# Patient Record
Sex: Male | Born: 1965 | Race: White | Hispanic: No | Marital: Married | Smoking: Former smoker
Health system: Southern US, Community
[De-identification: ages and names within clinical notes are randomized; demographics above are authoritative.]

## PROBLEM LIST (undated history)

## (undated) DIAGNOSIS — I480 Paroxysmal atrial fibrillation: Secondary | ICD-10-CM

## (undated) DIAGNOSIS — J309 Allergic rhinitis, unspecified: Secondary | ICD-10-CM

## (undated) DIAGNOSIS — G4730 Sleep apnea, unspecified: Secondary | ICD-10-CM

## (undated) DIAGNOSIS — J45909 Unspecified asthma, uncomplicated: Secondary | ICD-10-CM

## (undated) DIAGNOSIS — T783XXA Angioneurotic edema, initial encounter: Secondary | ICD-10-CM

## (undated) DIAGNOSIS — C4491 Basal cell carcinoma of skin, unspecified: Secondary | ICD-10-CM

## (undated) DIAGNOSIS — J449 Chronic obstructive pulmonary disease, unspecified: Secondary | ICD-10-CM

## (undated) DIAGNOSIS — J301 Allergic rhinitis due to pollen: Secondary | ICD-10-CM

## (undated) DIAGNOSIS — I5189 Other ill-defined heart diseases: Secondary | ICD-10-CM

## (undated) DIAGNOSIS — D3610 Benign neoplasm of peripheral nerves and autonomic nervous system, unspecified: Secondary | ICD-10-CM

## (undated) DIAGNOSIS — I10 Essential (primary) hypertension: Secondary | ICD-10-CM

## (undated) HISTORY — DX: Paroxysmal atrial fibrillation: I48.0

## (undated) HISTORY — DX: Sleep apnea, unspecified: G47.30

## (undated) HISTORY — PX: APPENDECTOMY: SHX54

## (undated) HISTORY — DX: Allergic rhinitis due to pollen: J30.1

## (undated) HISTORY — PX: LUNG SURGERY: SHX703

## (undated) HISTORY — DX: Allergic rhinitis, unspecified: J30.9

## (undated) HISTORY — DX: Unspecified asthma, uncomplicated: J45.909

## (undated) HISTORY — DX: Chronic obstructive pulmonary disease, unspecified: J44.9

---

## 2013-11-16 DIAGNOSIS — I160 Hypertensive urgency: Secondary | ICD-10-CM | POA: Insufficient documentation

## 2013-11-19 DIAGNOSIS — R42 Dizziness and giddiness: Secondary | ICD-10-CM | POA: Insufficient documentation

## 2013-11-19 DIAGNOSIS — J948 Other specified pleural conditions: Secondary | ICD-10-CM | POA: Insufficient documentation

## 2013-11-23 DIAGNOSIS — R011 Cardiac murmur, unspecified: Secondary | ICD-10-CM | POA: Insufficient documentation

## 2013-11-23 DIAGNOSIS — E669 Obesity, unspecified: Secondary | ICD-10-CM | POA: Insufficient documentation

## 2013-12-29 DIAGNOSIS — D3610 Benign neoplasm of peripheral nerves and autonomic nervous system, unspecified: Secondary | ICD-10-CM | POA: Insufficient documentation

## 2014-05-24 DIAGNOSIS — J90 Pleural effusion, not elsewhere classified: Secondary | ICD-10-CM | POA: Insufficient documentation

## 2014-07-09 DIAGNOSIS — R0902 Hypoxemia: Secondary | ICD-10-CM | POA: Insufficient documentation

## 2014-07-09 DIAGNOSIS — J159 Unspecified bacterial pneumonia: Secondary | ICD-10-CM | POA: Insufficient documentation

## 2015-02-04 ENCOUNTER — Encounter: Payer: Self-pay | Admitting: Physician Assistant

## 2015-02-04 ENCOUNTER — Ambulatory Visit (INDEPENDENT_AMBULATORY_CARE_PROVIDER_SITE_OTHER): Payer: Managed Care, Other (non HMO) | Admitting: Physician Assistant

## 2015-02-04 VITALS — BP 150/70 | HR 60 | Temp 97.9°F | Resp 16 | Wt 334.6 lb

## 2015-02-04 DIAGNOSIS — G4733 Obstructive sleep apnea (adult) (pediatric): Secondary | ICD-10-CM

## 2015-02-04 DIAGNOSIS — I10 Essential (primary) hypertension: Secondary | ICD-10-CM | POA: Insufficient documentation

## 2015-02-04 DIAGNOSIS — L309 Dermatitis, unspecified: Secondary | ICD-10-CM | POA: Diagnosis not present

## 2015-02-04 DIAGNOSIS — J45909 Unspecified asthma, uncomplicated: Secondary | ICD-10-CM | POA: Insufficient documentation

## 2015-02-04 DIAGNOSIS — N200 Calculus of kidney: Secondary | ICD-10-CM | POA: Diagnosis not present

## 2015-02-04 MED ORDER — LISINOPRIL 20 MG PO TABS: 20.0000 mg | ORAL_TABLET | Freq: Every day | ORAL | Status: DC

## 2015-02-04 MED ORDER — TRIAMCINOLONE ACETONIDE 0.1 % EX CREA: 1.0000 "application " | TOPICAL_CREAM | Freq: Two times a day (BID) | CUTANEOUS | Status: DC

## 2015-02-04 MED ORDER — VERAPAMIL HCL ER 120 MG PO TBCR: 120.0000 mg | EXTENDED_RELEASE_TABLET | Freq: Every day | ORAL | Status: DC

## 2015-02-04 NOTE — Progress Notes (Signed)
Patient: Geoffrey [name], Male    DOB: December 10, 1965, 49 y.o.   MRN: [id number] Visit Date: 02/04/2015  Today's Provider: Mar Daring, PA-C   Chief Complaint  Patient presents with  . Establish Care   Subjective:    Annual physical exam Geoffrey [name] is a 49 y.o. male who presents today for health maintenance and complete physical. He feels fairly well. He reports exercising walks everyday at work. He reports he is sleeping fairly well.   He does have an extensive past medical history. 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 They were both benign. This was done by Dr. Selena Batten with [facility]. He does see him annually for repeat chest x-rays. He states he was last seen by him approximately 5 months ago and his chest x-ray was normal. They also do pulmonary function testing for him. He does not have a regular pulmonologist.  He also has had echocardiograms and stress test done for chest pain which is now thought to have been caused by the tumors. These all came back negative. He does state he was told that he had a leaky aortic valve that was not problematic at this time and also some decreased wall movement and one of the walls but could not remember where. He also has had hypertension for a while. He states that before medication he would run in the 190s to 200s over 90s to 100s. He was put on HCTZ but did not tolerate this medication well. He also cannot tolerate beta blockers secondary to his lung disease as stated above. He currently is taking lisinopril 40 mg daily. He does not check his blood pressure regularly. He denies any chest pain, shortness of breath, dyspnea on exertion, orthopnea or lower extremity edema.  He also has obstructive sleep apnea. He has been on CPAP for a long time. He is requesting to have another sleep study as he does not feel that his CPAP is working well and he does feel that his mask is not fitting right for him any longer. This is  secondary to weight gain since his surgeries.  He also has a rash that he develops on his face and his right lower extremity on the anterior shin. It is red, scaly and itchy. He states that most often condoms during times where he is highly stressed and during times of dry weather.  -----------------------------------------------------------------   Review of Systems  Constitutional: Negative.   HENT: Negative.   Eyes: Negative.   Respiratory: Positive for apnea, shortness of breath and wheezing. Negative for cough, choking, chest tightness and stridor.   Gastrointestinal: Positive for constipation. Negative for nausea, vomiting, abdominal pain, diarrhea, blood in stool and abdominal distention.  Endocrine: Negative.   Genitourinary: Positive for urgency. Negative for dysuria, frequency, hematuria, flank pain, decreased urine volume, discharge, penile swelling, scrotal swelling, enuresis, difficulty urinating, genital sores, penile pain and testicular pain.  Musculoskeletal: Positive for neck stiffness (with stress). Negative for myalgias, back pain, joint swelling, arthralgias, gait problem and neck pain.  Skin: Positive for rash (right anterior lower leg). Negative for color change, pallor and wound.  Allergic/Immunologic: Negative.   Neurological: Positive for dizziness. Negative for seizures, syncope, weakness, light-headedness, numbness and headaches.  Hematological: Negative.   Psychiatric/Behavioral: Negative.     Social History He  reports that he has never smoked. His smokeless tobacco use includes Chew. He reports that he drinks alcohol. He reports that he does not use  illicit drugs. Social History   Social History  . Marital Status: Single    Spouse Name: N/A  . Number of Children: N/A  . Years of Education: N/A   Social History Main Topics  . Smoking status: Never Smoker   . Smokeless tobacco: Current User    Types: Chew  . Alcohol Use: Yes     Comment: 3-4  Bi-weekly; have been a heavy drinker in the past.  . Drug Use: No  . Sexual Activity: Not Asked   Other Topics Concern  . None   Social History Narrative  . None    Patient Active Problem List   Diagnosis Date Noted  . Asthma 02/04/2015  . Hypertension 02/04/2015  . Kidney stones 02/04/2015    History reviewed. No pertinent past surgical history.  Family History  Family Status  Relation Status Death Age  . Mother Deceased 80    aneurysm  . Father Alive    His family history is not on file.    Allergies  Allergen Reactions  . Aspirin     Previous Medications   LISINOPRIL (PRINIVIL,ZESTRIL) 20 MG TABLET    Take 20 mg by mouth 2 (two) times daily.    Patient Care Team: Mar Daring, PA-C as PCP - General (Family Medicine)     Objective:   Vitals: BP 150/70 mmHg  Pulse 60  Temp(Src) 97.9 F (36.6 C) (Oral)  Resp 16  Wt 334 lb 9.6 oz (151.774 kg)  SpO2 95%   Physical Exam  Constitutional: He is oriented to person, place, and time. He appears well-developed and well-nourished.  HENT:  Head: Normocephalic and atraumatic.  Right Ear: Hearing, tympanic membrane, external ear and ear canal normal.  Left Ear: Hearing, tympanic membrane, external ear and ear canal normal.  Nose: Nose normal.  Mouth/Throat: Uvula is midline, oropharynx is clear and moist and mucous membranes are normal.  Eyes: Conjunctivae and EOM are normal. Pupils are equal, round, and reactive to light. Right eye exhibits no discharge.  Neck: Normal range of motion. Neck supple. No JVD present. No tracheal deviation present. No thyromegaly present.  Cardiovascular: Normal rate, regular rhythm, normal heart sounds and intact distal pulses.  Exam reveals no gallop and no friction rub.   No murmur heard. Pulmonary/Chest: Effort normal and breath sounds normal. No respiratory distress. He has no wheezes. He has no rales.  Abdominal: Soft. Bowel sounds are normal. He exhibits no distension and  no mass. There is no tenderness. There is no rebound and no guarding.  Musculoskeletal: Normal range of motion. He exhibits no edema or tenderness.  Lymphadenopathy:    He has no cervical adenopathy.  Neurological: He is alert and oriented to person, place, and time. He has normal reflexes. No cranial nerve deficit. He exhibits normal muscle tone. Coordination normal.  Skin: Skin is warm and dry. Rash noted. No erythema.     Psychiatric: He has a normal mood and affect. His behavior is normal. Judgment and thought content normal.     Depression Screen No flowsheet data found.    Assessment & Plan:     Routine Health Maintenance and Physical Exam  1. Essential hypertension Blood pressure today was 150/70. His with him taking 40 mg of lisinopril. I advised that 40 mg of lisinopril is not recommended and he agreed to decrease the dose to 20 mg daily and add a second agent. We will try verapamil sustained release as below. I will see him back in  4 weeks to recheck his blood pressure. He is to call me if he has any adverse reactions with the verapamil. I will most likely check labs when he returns in 4 weeks. - lisinopril (PRINIVIL,ZESTRIL) 20 MG tablet; Take 1 tablet (20 mg total) by mouth daily.  Dispense: 30 tablet; Refill: 11 - verapamil (CALAN-SR) 120 MG CR tablet; Take 1 tablet (120 mg total) by mouth at bedtime.  Dispense: 30 tablet; Refill: 1  2. Kidney stones History of kidney stones. He has not had any recently.  3. Eczema I do feel that the rash that is located on his right lower extremity is consistent with eczema. We will attempt treatment with triamcinolone cream as below. He is to call the office if symptoms fail to improve or worsen with the rash. - triamcinolone cream (KENALOG) 0.1 %; Apply 1 application topically 2 (two) times daily.  Dispense: 80 g; Refill: 0  4. OSA (obstructive sleep apnea) He does have obstructive sleep apnea. He currently does have a CPAP machine  at his house which she uses but he does not feel the settings are appropriate for him nor does he feel his mask fits appropriately anymore. He states that he is starting to wake up with headaches like he did before he was diagnosed with sleep apnea. He would like to undergo another sleep study to see if his parameters and mask to be changed. I will make the referral for him and they will contact him to schedule the sleep study based on his schedule. He is to call the office if he has any questions or concerns. I will be seeing him back in 4 weeks to recheck his blood pressure and we can readdress this at that time as well. - Ambulatory referral to Sleep Studies   Exercise Activities and Dietary recommendations Goals    None       There is no immunization history on file for this patient.  Health Maintenance  Topic Date Due  . HIV Screening  12/22/1979  . TETANUS/TDAP  12/22/1983  . INFLUENZA VACCINE  10/25/2014  . COLONOSCOPY  12/22/2014      Discussed health benefits of physical activity, and encouraged him to engage in regular exercise appropriate for his age and condition.    I spent approximately 45 minutes with the patient today. Over 50% of this time was spent with counseling and educating the patient on hypertension and medications.  --------------------------------------------------------------------

## 2015-02-04 NOTE — Patient Instructions (Signed)
Hypertension Hypertension, commonly called high blood pressure, is when the force of blood pumping through your arteries is too strong. Your arteries are the blood vessels that carry blood from your heart throughout your body. A blood pressure reading consists of a higher number over a lower number, such as 110/72. The higher number (systolic) is the pressure inside your arteries when your heart pumps. The lower number (diastolic) is the pressure inside your arteries when your heart relaxes. Ideally you want your blood pressure below 120/80. Hypertension forces your heart to work harder to pump blood. Your arteries may become narrow or stiff. Having untreated or uncontrolled hypertension can cause heart attack, stroke, kidney disease, and other problems. RISK FACTORS Some risk factors for high blood pressure are controllable. Others are not.  Risk factors you cannot control include:   Race. You may be at higher risk if you are African American.  Age. Risk increases with age.  Gender. Men are at higher risk than women before age 45 years. After age 65, women are at higher risk than men. Risk factors you can control include:  Not getting enough exercise or physical activity.  Being overweight.  Getting too much fat, sugar, calories, or salt in your diet.  Drinking too much alcohol. SIGNS AND SYMPTOMS Hypertension does not usually cause signs or symptoms. Extremely high blood pressure (hypertensive crisis) may cause headache, anxiety, shortness of breath, and nosebleed. DIAGNOSIS To check if you have hypertension, your health care provider will measure your blood pressure while you are seated, with your arm held at the level of your heart. It should be measured at least twice using the same arm. Certain conditions can cause a difference in blood pressure between your right and left arms. A blood pressure reading that is higher than normal on one occasion does not mean that you need treatment. If  it is not clear whether you have high blood pressure, you may be asked to return on a different day to have your blood pressure checked again. Or, you may be asked to monitor your blood pressure at home for 1 or more weeks. TREATMENT Treating high blood pressure includes making lifestyle changes and possibly taking medicine. Living a healthy lifestyle can help lower high blood pressure. You may need to change some of your habits. Lifestyle changes may include:  Following the DASH diet. This diet is high in fruits, vegetables, and whole grains. It is low in salt, red meat, and added sugars.  Keep your sodium intake below 2,300 mg per day.  Getting at least 30-45 minutes of aerobic exercise at least 4 times per week.  Losing weight if necessary.  Not smoking.  Limiting alcoholic beverages.  Learning ways to reduce stress. Your health care provider may prescribe medicine if lifestyle changes are not enough to get your blood pressure under control, and if one of the following is true:  You are 18-59 years of age and your systolic blood pressure is above 140.  You are 60 years of age or older, and your systolic blood pressure is above 150.  Your diastolic blood pressure is above 90.  You have diabetes, and your systolic blood pressure is over 140 or your diastolic blood pressure is over 90.  You have kidney disease and your blood pressure is above 140/90.  You have heart disease and your blood pressure is above 140/90. Your personal target blood pressure may vary depending on your medical conditions, your age, and other factors. HOME CARE INSTRUCTIONS    Have your blood pressure rechecked as directed by your health care provider.   Take medicines only as directed by your health care provider. Follow the directions carefully. Blood pressure medicines must be taken as prescribed. The medicine does not work as well when you skip doses. Skipping doses also puts you at risk for  problems.  Do not smoke.   Monitor your blood pressure at home as directed by your health care provider. SEEK MEDICAL CARE IF:   You think you are having a reaction to medicines taken.  You have recurrent headaches or feel dizzy.  You have swelling in your ankles.  You have trouble with your vision. SEEK IMMEDIATE MEDICAL CARE IF:  You develop a severe headache or confusion.  You have unusual weakness, numbness, or feel faint.  You have severe chest or abdominal pain.  You vomit repeatedly.  You have trouble breathing. MAKE SURE YOU:   Understand these instructions.  Will watch your condition.  Will get help right away if you are not doing well or get worse.   This information is not intended to replace advice given to you by your health care provider. Make sure you discuss any questions you have with your health care provider.   Document Released: 03/12/2005 Document Revised: 07/27/2014 Document Reviewed: 01/02/2013 Elsevier Interactive Patient Education 2016 [place].   Trandolapril; Verapamil sustained-release tablets What is this medicine? TRANDOLAPRIL; VERAPAMIL (tran DOLE a pril; ver AP a mil) is a combination of two drugs. It is used to treat high blood pressure. This medicine may be used for other purposes; ask your health care provider or pharmacist if you have questions. What should I tell my health care provider before I take this medicine? They need to know if you have any of these conditions: -bone marrow disease -collagen-vascular disease like scleroderma -heart or blood vessel disease -if you are on a special diet, such as a low-salt diet -immune system disease like lupus -kidney or liver disease -low blood pressure -previous swelling of the tongue, face, or lips with difficulty breathing, difficulty swallowing, hoarseness, or tightening of the throat -slow or irregular heartbeat -an unusual or allergic reaction to trandolapril, verapamil,  other medicines, insect venom, foods, dyes, or preservatives -pregnant or trying to get pregnant -breast-feeding How should I use this medicine? Take this medicine by mouth with a glass of water. Follow the directions on the prescription label. Swallow whole. Do not break, crush or chew. Take this medicine with food. Take your doses at regular intervals. Do not take your medicine more often than directed. Do not stop taking except on the advice of your doctor or health care professional. Talk to your pediatrician regarding the use of this medicine in children. Special care may be needed. Overdosage: If you think you have taken too much of this medicine contact a poison control center or emergency room at once. NOTE: This medicine is only for you. Do not share this medicine with others. What if I miss a dose? If you miss a dose, take it as soon as you can. If it is almost time for your next dose, take only that dose. Do not take double or extra doses. What may interact with this medicine? Do not take this medicine with any of the following medications: -alfuzosin -cisapride -disopyramide -dofetilide -pimozide -red yeast rice This medicine may also interact with the following medications: -barbiturates such as phenobarbital -cimetidine -cyclosporine -diuretics -grapefruit juice -lithium -local anesthetics or general anesthetics -medicines for heart-rhythm problems  like amiodarone, digoxin, flecainide, procainamide, quinidine -medicines for high blood pressure or heart problems -medicines for seizures like carbamazepine and phenytoin -potassium salts or potassium supplements -rifampin, rifabutin, rifapentine -theophylline or aminophylline This list may not describe all possible interactions. Give your health care provider a list of all the medicines, herbs, non-prescription drugs, or dietary supplements you use. Also tell them if you smoke, drink alcohol, or use illegal drugs. Some items  may interact with your medicine. What should I watch for while using this medicine? Visit your doctor or health care professional for regular checks on your progress. Check your blood pressure as directed. Ask your doctor or health care professional what your blood pressure should be and when you should contact him or her. Call your doctor or health care professional if you notice an irregular or fast heart beat. You may get drowsy or dizzy. Do not drive, use machinery, or do anything that needs mental alertness until you know how this drug affects you. Do not stand or sit up quickly, especially if you are an older patient. This reduces the risk of dizzy or fainting spells. Alcohol can make you more drowsy and dizzy. Avoid alcoholic drinks. Women should inform their doctor if they wish to become pregnant or think they might be pregnant. There is a potential for serious side effects to an unborn child. Talk to your health care professional or pharmacist for more information. Check with your doctor or health care professional if you get an attack of severe diarrhea, nausea and vomiting, or if you sweat a lot. The loss of too much body fluid can make it dangerous for you to take this medicine. Avoid salt substitutes unless you are told otherwise by your doctor or health care professional. Do not treat yourself for coughs, colds, or pain while you are taking this medicine without asking your doctor or health care professional for advice. Some ingredients may increase your blood pressure. This medicine can sometimes cause dental problems for some patients. Clean and floss your teeth carefully and regularly. Check with your dentist if your gums get swollen or inflamed and have the dentist clean your teeth regularly. What side effects may I notice from receiving this medicine? Side effects that you should report to your doctor or health care professional as soon as possible: -allergic reactions like skin rash or  hives, swelling of the hands, feet, face, lips, throat, or tongue -decreased amount of urine passed -difficulty breathing, or swallowing -persistent dry cough -redness, blistering, peeling or loosening of the skin, including inside the mouth -stomach pain -swelling of your legs and ankles -irregular or fast heartbeat, chest pain, palpitations Side effects that usually do not require medical attention (report to your doctor or health care professional if they continue or are bothersome): -decreased sexual function or desire -drowsiness or dizziness -facial flushing -headache This list may not describe all possible side effects. Call your doctor for medical advice about side effects. You may report side effects to FDA at 1-800-FDA-1088. Where should I keep my medicine? Keep out of the reach of children. Store at room temperature between 15 and 25 degrees C (59 and 77 degrees F). Keep container tightly closed. Throw away any unused medicine after the expiration date. NOTE: This sheet is a summary. It may not cover all possible information. If you have questions about this medicine, talk to your doctor, pharmacist, or health care provider.    2016, Elsevier/Gold Standard. (2007-11-27 11:33:54)

## 2015-03-04 ENCOUNTER — Ambulatory Visit (INDEPENDENT_AMBULATORY_CARE_PROVIDER_SITE_OTHER): Payer: Managed Care, Other (non HMO) | Admitting: Physician Assistant

## 2015-03-04 ENCOUNTER — Encounter: Payer: Self-pay | Admitting: Physician Assistant

## 2015-03-04 VITALS — BP 162/90 | HR 61 | Temp 98.6°F | Resp 16 | Wt 330.6 lb

## 2015-03-04 DIAGNOSIS — I10 Essential (primary) hypertension: Secondary | ICD-10-CM

## 2015-03-04 MED ORDER — VERAPAMIL HCL ER 120 MG PO TBCR: 120.0000 mg | EXTENDED_RELEASE_TABLET | Freq: Every day | ORAL | Status: DC

## 2015-03-04 NOTE — Patient Instructions (Signed)
Hypertension Hypertension, commonly called high blood pressure, is when the force of blood pumping through your arteries is too strong. Your arteries are the blood vessels that carry blood from your heart throughout your body. A blood pressure reading consists of a higher number over a lower number, such as 110/72. The higher number (systolic) is the pressure inside your arteries when your heart pumps. The lower number (diastolic) is the pressure inside your arteries when your heart relaxes. Ideally you want your blood pressure below 120/80. Hypertension forces your heart to work harder to pump blood. Your arteries may become narrow or stiff. Having untreated or uncontrolled hypertension can cause heart attack, stroke, kidney disease, and other problems. RISK FACTORS Some risk factors for high blood pressure are controllable. Others are not.  Risk factors you cannot control include:   Race. You may be at higher risk if you are African American.  Age. Risk increases with age.  Gender. Men are at higher risk than women before age 45 years. After age 65, women are at higher risk than men. Risk factors you can control include:  Not getting enough exercise or physical activity.  Being overweight.  Getting too much fat, sugar, calories, or salt in your diet.  Drinking too much alcohol. SIGNS AND SYMPTOMS Hypertension does not usually cause signs or symptoms. Extremely high blood pressure (hypertensive crisis) may cause headache, anxiety, shortness of breath, and nosebleed. DIAGNOSIS To check if you have hypertension, your health care provider will measure your blood pressure while you are seated, with your arm held at the level of your heart. It should be measured at least twice using the same arm. Certain conditions can cause a difference in blood pressure between your right and left arms. A blood pressure reading that is higher than normal on one occasion does not mean that you need treatment. If  it is not clear whether you have high blood pressure, you may be asked to return on a different day to have your blood pressure checked again. Or, you may be asked to monitor your blood pressure at home for 1 or more weeks. TREATMENT Treating high blood pressure includes making lifestyle changes and possibly taking medicine. Living a healthy lifestyle can help lower high blood pressure. You may need to change some of your habits. Lifestyle changes may include:  Following the DASH diet. This diet is high in fruits, vegetables, and whole grains. It is low in salt, red meat, and added sugars.  Keep your sodium intake below 2,300 mg per day.  Getting at least 30-45 minutes of aerobic exercise at least 4 times per week.  Losing weight if necessary.  Not smoking.  Limiting alcoholic beverages.  Learning ways to reduce stress. Your health care provider may prescribe medicine if lifestyle changes are not enough to get your blood pressure under control, and if one of the following is true:  You are 18-59 years of age and your systolic blood pressure is above 140.  You are 60 years of age or older, and your systolic blood pressure is above 150.  Your diastolic blood pressure is above 90.  You have diabetes, and your systolic blood pressure is over 140 or your diastolic blood pressure is over 90.  You have kidney disease and your blood pressure is above 140/90.  You have heart disease and your blood pressure is above 140/90. Your personal target blood pressure may vary depending on your medical conditions, your age, and other factors. HOME CARE INSTRUCTIONS    Have your blood pressure rechecked as directed by your health care provider.   Take medicines only as directed by your health care provider. Follow the directions carefully. Blood pressure medicines must be taken as prescribed. The medicine does not work as well when you skip doses. Skipping doses also puts you at risk for  problems.  Do not smoke.   Monitor your blood pressure at home as directed by your health care provider. SEEK MEDICAL CARE IF:   You think you are having a reaction to medicines taken.  You have recurrent headaches or feel dizzy.  You have swelling in your ankles.  You have trouble with your vision. SEEK IMMEDIATE MEDICAL CARE IF:  You develop a severe headache or confusion.  You have unusual weakness, numbness, or feel faint.  You have severe chest or abdominal pain.  You vomit repeatedly.  You have trouble breathing. MAKE SURE YOU:   Understand these instructions.  Will watch your condition.  Will get help right away if you are not doing well or get worse.   This information is not intended to replace advice given to you by your health care provider. Make sure you discuss any questions you have with your health care provider.   Document Released: 03/12/2005 Document Revised: 07/27/2014 Document Reviewed: 01/02/2013 Elsevier Interactive Patient Education 2016 Elsevier Inc.  

## 2015-03-04 NOTE — Progress Notes (Signed)
Patient: Geoffrey [name]    DOB: [birth year]   49 y.o.   MRN: [id number] Visit Date: 03/04/2015  Today's Provider: Mar Daring, PA-C   Chief Complaint  Patient presents with  . Follow-up    Hypertension   Subjective:    Hypertension Chronicity: Follow-up. Associated symptoms include headaches (3 weeks ago) and malaise/fatigue (the first 3 weeks ). Pertinent negatives include no chest pain or palpitations. (Per patient 3 weeks ago he was feeling like if he was "drunk") The current treatment provides mild (In the last week) improvement. Compliance problems: Walks every day.   Patient current medications are Lisinopril and Verapamil.     Allergies  Allergen Reactions  . Aspirin    Previous Medications   LISINOPRIL (PRINIVIL,ZESTRIL) 20 MG TABLET    Take 1 tablet (20 mg total) by mouth daily.   TRIAMCINOLONE CREAM (KENALOG) 0.1 %    Apply 1 application topically 2 (two) times daily.   VERAPAMIL (CALAN-SR) 120 MG CR TABLET    Take 1 tablet (120 mg total) by mouth at bedtime.    Review of Systems  Constitutional: Positive for malaise/fatigue (the first 3 weeks ). Negative for fever, chills and appetite change.  HENT: Negative for congestion, ear pain, sinus pressure, sore throat, tinnitus, trouble swallowing and voice change.   Eyes: Negative.  Negative for visual disturbance.  Respiratory: Negative.   Cardiovascular: Negative for chest pain, palpitations and leg swelling.  Gastrointestinal: Negative.   Neurological: Positive for headaches (3 weeks ago).    Social History  Substance Use Topics  . Smoking status: Never Smoker   . Smokeless tobacco: Current User    Types: Chew  . Alcohol Use: Yes     Comment: 3-4 Bi-weekly; have been a heavy drinker in the past.   Objective:   BP 162/90 mmHg  Pulse 61  Temp(Src) 98.6 F (37 C) (Oral)  Resp 16  Wt 330 lb 9.6 oz (149.959 kg)  SpO2 96%  Physical Exam  Constitutional: He appears well-developed and  well-nourished. No distress.  Cardiovascular: Normal rate, regular rhythm and normal heart sounds.  Exam reveals no gallop and no friction rub.   No murmur heard. Pulmonary/Chest: Breath sounds normal. No respiratory distress. He has no wheezes. He has no rales.  Skin: He is not diaphoretic.  Vitals reviewed.       Assessment & Plan:     1. Essential hypertension He did not have much improvement in his blood pressure with decreasing the lisinopril and adding verapamil. He did have some side effects of the verapamil initially but states that these have now subsided and he feels great. Blood pressure in the office today was 162/90. I will increase verapamil to 1 tablet twice daily. I will also check labs as below. I will follow up with him pending these lab results. He also is going for his repeat sleep study on December 15. If his labs are stable and he tolerates the increase in verapamil I will see him back in 2 months to recheck his blood pressure and see how he is doing at that time. He is to call the office if he has any adverse reactions, acute issue, question or concerns in the meantime. - Comprehensive Metabolic Panel (CMET) - verapamil (CALAN-SR) 120 MG CR tablet; Take 1 tablet (120 mg total) by mouth at bedtime.  Dispense: 60 tablet; Refill: 1 - CBC with Differential       Mar Daring, PA-C  [facility]

## 2015-03-10 ENCOUNTER — Ambulatory Visit: Payer: Managed Care, Other (non HMO) | Attending: Otolaryngology

## 2015-03-10 DIAGNOSIS — R0683 Snoring: Secondary | ICD-10-CM | POA: Insufficient documentation

## 2015-03-10 DIAGNOSIS — G4733 Obstructive sleep apnea (adult) (pediatric): Secondary | ICD-10-CM | POA: Insufficient documentation

## 2015-03-23 ENCOUNTER — Other Ambulatory Visit: Payer: Self-pay | Admitting: Physician Assistant

## 2015-03-23 DIAGNOSIS — I10 Essential (primary) hypertension: Secondary | ICD-10-CM

## 2015-03-23 MED ORDER — VERAPAMIL HCL ER 120 MG PO TBCR
120.0000 mg | EXTENDED_RELEASE_TABLET | Freq: Two times a day (BID) | ORAL | Status: DC
Start: 2015-03-23 — End: 2015-04-05

## 2015-04-05 ENCOUNTER — Other Ambulatory Visit: Payer: Self-pay | Admitting: Physician Assistant

## 2015-04-05 DIAGNOSIS — I10 Essential (primary) hypertension: Secondary | ICD-10-CM

## 2015-04-05 MED ORDER — VERAPAMIL HCL ER 120 MG PO TBCR: 120.0000 mg | EXTENDED_RELEASE_TABLET | Freq: Two times a day (BID) | ORAL | Status: DC

## 2015-04-05 MED ORDER — LISINOPRIL 20 MG PO TABS: 20.0000 mg | ORAL_TABLET | Freq: Every day | ORAL | Status: DC

## 2015-04-05 NOTE — Telephone Encounter (Signed)
Pt contacted office for refill request on the following medications:  Medicap.  [id number]  verapamil (CALAN-SR) 120 MG CR tablet-pt is requesting this changed to 60 tablets per pt is taking 2 per day.  lisinopril (PRINIVIL,ZESTRIL) 20 MG tablet

## 2015-04-05 NOTE — Telephone Encounter (Signed)
Please advise.  Thanks,  -Joseline 

## 2015-04-07 ENCOUNTER — Ambulatory Visit (INDEPENDENT_AMBULATORY_CARE_PROVIDER_SITE_OTHER): Payer: Managed Care, Other (non HMO) | Admitting: Physician Assistant

## 2015-04-07 ENCOUNTER — Ambulatory Visit
Admission: RE | Admit: 2015-04-07 | Discharge: 2015-04-07 | Disposition: A | Discharge status: Home / Self Care | Payer: Managed Care, Other (non HMO) | Source: Ambulatory Visit | Attending: Physician Assistant | Admitting: Physician Assistant

## 2015-04-07 ENCOUNTER — Telehealth: Payer: Self-pay | Admitting: Physician Assistant

## 2015-04-07 ENCOUNTER — Encounter: Payer: Self-pay | Admitting: Physician Assistant

## 2015-04-07 VITALS — BP 156/70 | HR 60 | Temp 98.5°F | Resp 15 | Wt 336.4 lb

## 2015-04-07 DIAGNOSIS — Z86018 Personal history of other benign neoplasm: Secondary | ICD-10-CM

## 2015-04-07 DIAGNOSIS — Z23 Encounter for immunization: Secondary | ICD-10-CM

## 2015-04-07 DIAGNOSIS — R042 Hemoptysis: Secondary | ICD-10-CM | POA: Diagnosis not present

## 2015-04-07 DIAGNOSIS — R918 Other nonspecific abnormal finding of lung field: Secondary | ICD-10-CM | POA: Diagnosis not present

## 2015-04-07 DIAGNOSIS — Z8709 Personal history of other diseases of the respiratory system: Secondary | ICD-10-CM | POA: Diagnosis not present

## 2015-04-07 DIAGNOSIS — R0602 Shortness of breath: Secondary | ICD-10-CM | POA: Insufficient documentation

## 2015-04-07 DIAGNOSIS — Z9889 Other specified postprocedural states: Secondary | ICD-10-CM | POA: Insufficient documentation

## 2015-04-07 DIAGNOSIS — L739 Follicular disorder, unspecified: Secondary | ICD-10-CM | POA: Diagnosis not present

## 2015-04-07 MED ORDER — CEPHALEXIN 500 MG PO CAPS: 500.0000 mg | ORAL_CAPSULE | Freq: Two times a day (BID) | ORAL | Status: DC

## 2015-04-07 NOTE — Addendum Note (Signed)
Addended by: Mar Daring on: 04/07/2015 01:17 PM   Modules accepted: Orders

## 2015-04-07 NOTE — Progress Notes (Signed)
Patient: Geoffrey [name] Male    DOB: 09-05-1965   50 y.o.   MRN: [id number] Visit Date: 04/07/2015  Today's Provider: Mar Daring, PA-C   Chief Complaint  Patient presents with  . Bronw spot on his left side of chest   Subjective:    HPI  Geoffrey [name] is here concern about a little brown spot that is on the left side on the upper side of his chest. Noticed for the past week. Per patient it was like a light brown spot and for the past 2 weeks they notice it has raised and is more brownish and is itching. No fever. Patient also states that has been feeling more fatigue, and for the last two month patient has been spitting up more bright red bloody sputum. His chest hurts on the left side, almost feels like when his lung had fluid in it. He said yesterday he had the discomfort and his chest was feeling heavy. He turned on his left side to face his wife and got up to cough and spit up blood. He said his appetite has change. No URI symptoms.  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  This was done by Dr. Selena Batten at [facility].  He does not wish to f/u with him anymore. He would like a local CT Psychologist, sport and exercise.     Allergies  Allergen Reactions  . Aspirin    Previous Medications   LISINOPRIL (PRINIVIL,ZESTRIL) 20 MG TABLET    Take 1 tablet (20 mg total) by mouth daily.   TRIAMCINOLONE CREAM (KENALOG) 0.1 %    Apply 1 application topically 2 (two) times daily.   VERAPAMIL (CALAN-SR) 120 MG CR TABLET    Take 1 tablet (120 mg total) by mouth 2 (two) times daily.    Review of Systems  Constitutional: Positive for appetite change and fatigue.  HENT: Negative.   Eyes: Negative.   Respiratory: Positive for cough (hemoptysis) and shortness of breath. Negative for chest tightness and wheezing.   Cardiovascular: Positive for chest pain. Negative for palpitations and leg swelling.    Gastrointestinal: Negative.   Endocrine: Negative.   Genitourinary: Negative.   Musculoskeletal: Negative.   Skin: Negative.   Allergic/Immunologic: Negative.   Neurological: Negative.   Hematological: Negative.   Psychiatric/Behavioral: Negative.     Social History  Substance Use Topics  . Smoking status: Never Smoker   . Smokeless tobacco: Current User    Types: Chew  . Alcohol Use: Yes     Comment: 3-4 Bi-weekly; have been a heavy drinker in the past.   Objective:   BP 156/70 mmHg  Pulse 60  Temp(Src) 98.5 F (36.9 C) (Oral)  Resp 15  Wt 336 lb 6.4 oz (152.59 kg)  SpO2 96%  Physical Exam  Constitutional: He appears well-developed and well-nourished. No distress.  HENT:  Head: Normocephalic and atraumatic.  Neck: Normal range of motion. Neck supple. No JVD present. No tracheal deviation present. No thyromegaly present.  Cardiovascular: Normal rate, regular rhythm and normal heart sounds.  Exam reveals no gallop and no friction rub.   No murmur heard. Pulmonary/Chest: Effort normal and breath sounds normal. No respiratory distress. He has no wheezes. He has no rales.  Lymphadenopathy:    He has no cervical adenopathy.  Skin: Lesion noted. He is not diaphoretic.     Vitals reviewed.  Assessment & Plan:     1. Folliculitis Will treat below with Keflex.  He is to call the office if the area does not improve or worsens with the antibiotic.  If there is no improvement we will consider cryo therapy vs shave removal. He does not wish to have a biopsy on it.  He states he watched his 1st wife pass away from malignant melanoma and does not wish to know if that is what it is. - cephALEXin (KEFLEX) 500 MG capsule; Take 1 capsule (500 mg total) by mouth 2 (two) times daily.  Dispense: 10 capsule; Refill: 0  2. Hemoptysis Will get CXR to see if he is developing a fluid burden again in his left lung.  If CXR shows fluid will refer to CT surgery (Dr. Faith Rogue) for further  evaluation and treatment.  He also does not wish to have a pulmonologist, states he would only want a CT surgeon. I will f/u pending the CXR results. - DG Chest 2 View; Future  3. SOB (shortness of breath) See above medical treatment plan  4. H/O benign neoplasm of bronchus and lung Will give pneumococcal vaccine for prevention of pneumonia due to his lung history. Prevnar 13 given without complication. - Pneumococcal conjugate vaccine 13-valent IM  5. History of thoracentesis Done by Dr. Selena Batten, [facility].  Last f/u was approx 8 months ago.  6. Need for influenza vaccination Flu vaccine given today without complication. - Flu Vaccine QUAD 36+ mos IM       Mar Daring, PA-C  [facility]

## 2015-04-07 NOTE — Telephone Encounter (Signed)
Opened in error

## 2015-04-07 NOTE — Patient Instructions (Signed)
Folliculitis °Folliculitis is redness, soreness, and swelling (inflammation) of the hair follicles. This condition can occur anywhere on the body. People with weakened immune systems, diabetes, or obesity have a greater risk of getting folliculitis. °CAUSES °· Bacterial infection. This is the most common cause. °· Fungal infection. °· Viral infection. °· Contact with certain chemicals, especially oils and tars. °Long-term folliculitis can result from bacteria that live in the nostrils. The bacteria may trigger multiple outbreaks of folliculitis over time. °SYMPTOMS °Folliculitis most commonly occurs on the scalp, thighs, legs, back, buttocks, and areas where hair is shaved frequently. An early sign of folliculitis is a small, white or yellow, pus-filled, itchy lesion (pustule). These lesions appear on a red, inflamed follicle. They are usually less than 0.2 inches (5 mm) wide. When there is an infection of the follicle that goes deeper, it becomes a boil or furuncle. A group of closely packed boils creates a larger lesion (carbuncle). Carbuncles tend to occur in hairy, sweaty areas of the body. °DIAGNOSIS  °Your caregiver can usually tell what is wrong by doing a physical exam. A sample may be taken from one of the lesions and tested in a lab. This can help determine what is causing your folliculitis. °TREATMENT  °Treatment may include: °· Applying warm compresses to the affected areas. °· Taking antibiotic medicines orally or applying them to the skin. °· Draining the lesions if they contain a large amount of pus or fluid. °· Laser hair removal for cases of long-lasting folliculitis. This helps to prevent regrowth of the hair. °HOME CARE INSTRUCTIONS °· Apply warm compresses to the affected areas as directed by your caregiver. °· If antibiotics are prescribed, take them as directed. Finish them even if you start to feel better. °· You may take over-the-counter medicines to relieve itching. °· Do not shave irritated  skin. °· Follow up with your caregiver as directed. °SEEK IMMEDIATE MEDICAL CARE IF:  °· You have increasing redness, swelling, or pain in the affected area. °· You have a fever. °MAKE SURE YOU: °· Understand these instructions. °· Will watch your condition. °· Will get help right away if you are not doing well or get worse. °  °This information is not intended to replace advice given to you by your health care provider. Make sure you discuss any questions you have with your health care provider. °  °Document Released: 05/21/2001 Document Revised: 04/02/2014 Document Reviewed: 06/12/2011 °Elsevier Interactive Patient Education ©2016 Elsevier Inc. ° °

## 2015-04-08 ENCOUNTER — Telehealth: Payer: Self-pay | Admitting: Physician Assistant

## 2015-04-13 ENCOUNTER — Encounter: Payer: Self-pay | Admitting: Physician Assistant

## 2015-04-14 ENCOUNTER — Telehealth: Payer: Self-pay | Admitting: Physician Assistant

## 2015-04-14 DIAGNOSIS — Z9889 Other specified postprocedural states: Secondary | ICD-10-CM

## 2015-04-14 DIAGNOSIS — Z8709 Personal history of other diseases of the respiratory system: Secondary | ICD-10-CM

## 2015-04-14 DIAGNOSIS — R0602 Shortness of breath: Secondary | ICD-10-CM

## 2015-04-14 DIAGNOSIS — Z86018 Personal history of other benign neoplasm: Secondary | ICD-10-CM

## 2015-04-14 NOTE — Telephone Encounter (Signed)
Insurance company is denying CT W/WO contrast but state they can approve CT with contrast.Phone # for insurance company [phone] if you do not agree

## 2015-04-15 NOTE — Telephone Encounter (Signed)
New order placed

## 2015-04-15 NOTE — Telephone Encounter (Signed)
I will need new order put in [facility]

## 2015-04-15 NOTE — Telephone Encounter (Signed)
We can do a CT chest w/o first and go from there.

## 2015-04-21 ENCOUNTER — Ambulatory Visit: Payer: Managed Care, Other (non HMO) | Attending: Physician Assistant

## 2015-05-04 ENCOUNTER — Telehealth: Payer: Self-pay | Admitting: Physician Assistant

## 2015-05-04 NOTE — Telephone Encounter (Signed)
Please advise 

## 2015-05-04 NOTE — Telephone Encounter (Signed)
Pt stated that he doesn't have his CPAP machine yet and wanted to see if there was anything our office could do to help get him a CPAP machine. Thanks TNP

## 2015-05-04 NOTE — Telephone Encounter (Signed)
Patient advised as directed below.  Thanks,  -Currie Dennin 

## 2015-05-04 NOTE — Telephone Encounter (Signed)
Spoke with someone from sleep med yesterday and they inform me that they had forgot to check something on the prescription that was sent over and got a verbal order for the replacement of the machine. This should be happening in the next week or so.

## 2015-05-06 ENCOUNTER — Ambulatory Visit: Payer: Managed Care, Other (non HMO) | Admitting: Physician Assistant

## 2015-08-29 ENCOUNTER — Telehealth: Payer: Self-pay | Admitting: Physician Assistant

## 2015-08-29 DIAGNOSIS — Z86018 Personal history of other benign neoplasm: Secondary | ICD-10-CM

## 2015-08-29 DIAGNOSIS — J4520 Mild intermittent asthma, uncomplicated: Secondary | ICD-10-CM

## 2015-08-29 DIAGNOSIS — Z9889 Other specified postprocedural states: Secondary | ICD-10-CM

## 2015-08-29 DIAGNOSIS — Z8709 Personal history of other diseases of the respiratory system: Secondary | ICD-10-CM

## 2015-08-29 DIAGNOSIS — R0602 Shortness of breath: Secondary | ICD-10-CM

## 2015-08-29 NOTE — Telephone Encounter (Signed)
Pt called wanted to speak to [place].  Would not give me any information as to why.  Pt's call back is   Thanks [employer]

## 2015-08-30 NOTE — Telephone Encounter (Signed)
Spoke with patient. Having worsening SOB like before with when he required a thoracentesis and coughing up some blood and a "white, fibrous-like" material. Referral order placed for pulmonology.

## 2015-08-31 ENCOUNTER — Ambulatory Visit (INDEPENDENT_AMBULATORY_CARE_PROVIDER_SITE_OTHER): Payer: Managed Care, Other (non HMO) | Admitting: Pulmonary Disease

## 2015-08-31 ENCOUNTER — Encounter: Payer: Self-pay | Admitting: Pulmonary Disease

## 2015-08-31 VITALS — BP 138/72 | HR 58 | Ht 74.0 in | Wt 335.0 lb

## 2015-08-31 DIAGNOSIS — R0600 Dyspnea, unspecified: Secondary | ICD-10-CM | POA: Diagnosis not present

## 2015-08-31 DIAGNOSIS — R042 Hemoptysis: Secondary | ICD-10-CM | POA: Diagnosis not present

## 2015-08-31 DIAGNOSIS — Z87891 Personal history of nicotine dependence: Secondary | ICD-10-CM

## 2015-08-31 DIAGNOSIS — Z9889 Other specified postprocedural states: Secondary | ICD-10-CM | POA: Diagnosis not present

## 2015-08-31 MED ORDER — FLUTICASONE FUROATE-VILANTEROL 100-25 MCG/INH IN AEPB: 1.0000 | INHALATION_SPRAY | Freq: Every day | RESPIRATORY_TRACT | Status: AC

## 2015-08-31 MED ORDER — FLUTICASONE FUROATE-VILANTEROL 200-25 MCG/INH IN AEPB: 1.0000 | INHALATION_SPRAY | Freq: Every day | RESPIRATORY_TRACT | Status: DC

## 2015-08-31 NOTE — Progress Notes (Signed)
Patient ID: Geoffrey [name], male   DOB: Mar 18, 1966, 50 y.o.   MRN: [id number] Patient seen in the office today and instructed on use of breo ellipta.  Patient expressed understanding and demonstrated technique.

## 2015-09-02 ENCOUNTER — Ambulatory Visit
Admission: RE | Admit: 2015-09-02 | Discharge: 2015-09-02 | Disposition: A | Discharge status: Home / Self Care | Payer: Managed Care, Other (non HMO) | Source: Ambulatory Visit | Attending: Pulmonary Disease | Admitting: Pulmonary Disease

## 2015-09-02 DIAGNOSIS — R938 Abnormal findings on diagnostic imaging of other specified body structures: Secondary | ICD-10-CM | POA: Diagnosis not present

## 2015-09-02 DIAGNOSIS — R918 Other nonspecific abnormal finding of lung field: Secondary | ICD-10-CM | POA: Diagnosis not present

## 2015-09-02 DIAGNOSIS — K7689 Other specified diseases of liver: Secondary | ICD-10-CM | POA: Insufficient documentation

## 2015-09-02 DIAGNOSIS — R042 Hemoptysis: Secondary | ICD-10-CM | POA: Insufficient documentation

## 2015-09-02 DIAGNOSIS — R0600 Dyspnea, unspecified: Secondary | ICD-10-CM | POA: Diagnosis not present

## 2015-09-02 DIAGNOSIS — Z9889 Other specified postprocedural states: Secondary | ICD-10-CM | POA: Diagnosis not present

## 2015-09-02 HISTORY — DX: Essential (primary) hypertension: I10

## 2015-09-02 HISTORY — DX: Basal cell carcinoma of skin, unspecified: C44.91

## 2015-09-02 MED ORDER — IOPAMIDOL (ISOVUE-370) INJECTION 76%
75.0000 mL | Freq: Once | INTRAVENOUS | Status: AC | PRN
  Administered 2015-09-02: 75 mL via INTRAVENOUS

## 2015-09-02 NOTE — Progress Notes (Signed)
PULMONARY CONSULT NOTE  Requesting MD/Service: Fenton Malling Date of initial consultation: 08/31/15 Reason for consultation: Chest discomfort, hemoptysis  PT PROFILE: 50 y.o. Geoffrey [name] former smoker with complicated (and incomplete) pulmonary history including thoracotomy @ [facility] in 2015 revealing "benging tumors". This was complicated by ? Hemothorax, / chylothorax requiring long term pleural catheter drainage. Referred for evaluation of persistent L sided chest discomfort and intermittent scant hemoptysis  HPI:  Geoffrey Geoffrey [name] former smoker referred by Dr Marlyn Corporal for chest discomfort, hemoptysis. This is a complicated history and some details are presently not clarified. He reports intermittent chest discomfort for 10 or more years and intermittent hemoptysis for at least 5 yrs. He underwent L thoracotomy at [facility] and resection of "benign tumors". Post op course was complicated by pleural effusion that was bloody on thoracentesis and required pleural catheter placement. 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 He had a pleural catheter in place for at least several weeks. He now describes DOE with ambulation and recurrent L sided chest discomfort. His dyspnea is waxing and waning and on the day of this evaluation is improved. He also reports frequent hemoptysis - sometimes frothy pink sputum, sometimes very thick mucus with streaks of blood. He has a 26 PY history of smoking. He quit in 2011. He has a PMH of asthma that is not currently treated. He is employed in [employer] without significant environmental exposures. Denies fever, purulent sputum, LE edema and calf tenderness.    Past Medical History  Diagnosis Date  . COPD (chronic obstructive pulmonary disease) ([place])   . Asthma   . Allergic rhinitis   . Hay fever   . Sleep apnea     Past Surgical History  Procedure Laterality Date  . Lung surgery    . Appendectomy      MEDICATIONS: I have  reviewed all medications and confirmed regimen as documented  Social History   Social History  . Marital Status: Single    Spouse Name: N/A  . Number of Children: N/A  . Years of Education: N/A   Occupational History  . Not on file.   Social History Main Topics  . Smoking status: Former Smoker    Types: Pipe, Cigars, Cigarettes    Quit date: 09/25/2010  . Smokeless tobacco: Current User    Types: Chew  . Alcohol Use: Yes     Comment: 3-4 Bi-weekly; have been a heavy drinker in the past.  . Drug Use: No  . Sexual Activity: Not on file   Other Topics Concern  . Not on file   Social History Narrative    Family History  Problem Relation Age of Onset  . Aneurysm Mother   . Hypertension Father     ROS: No fever, myalgias/arthralgias, unexplained weight loss or weight gain No new focal weakness or sensory deficits No otalgia, hearing loss, visual changes, nasal and sinus symptoms, mouth and throat problems No neck pain or adenopathy No abdominal pain, N/V/D, diarrhea, change in bowel pattern No dysuria, change in urinary pattern   Filed Vitals:   08/31/15 1334  BP: 138/72  Pulse: 58  Height: 6\' 2"  (1.88 Geoffrey [name])  Weight: 335 lb (151.955 kg)  SpO2: 94%     EXAM:  Gen: Obese, No overt respiratory distress HEENT: NCAT, sclera white, oropharynx normal Neck: Supple without LAN, thyromegaly, JVD Lungs: breath sounds: distant, percussion: normal, No adventitious sounds Cardiovascular: RRR, no murmurs noted Abdomen: Soft, nontender, normal BS  Ext: without clubbing, cyanosis, edema Neuro: CNs grossly intact, motor and sensory intact Skin: Limited exam, no lesions noted  DATA:   No flowsheet data found.  No flowsheet data found.  CXR 04/07/15:  NACPD  IMPRESSION:   1) prior lung surgery, resection of benign "tumors" (@ [place]) 2) Exertional dyspnea with prior history of asthma 3) Hemoptysis 4)  Sided chest discomfort   PLAN:  1) we will try to obtain records  from Dr Selena Batten @ [facility] 2) CT chest  3) Bronchoscopy to investigate hemoptysis 4) Trial of Breo 5) ROV in 4 weeks with PFTs   Merton Border, MD PCCM service Mobile [phone] Pager [phone] 09/02/2015

## 2015-09-05 ENCOUNTER — Telehealth: Payer: Self-pay | Admitting: Pulmonary Disease

## 2015-09-05 NOTE — Telephone Encounter (Signed)
No significant findings on CT chest  Geoffrey [name]

## 2015-09-05 NOTE — Telephone Encounter (Signed)
Pt is wanting CT results. Please advise.

## 2015-09-05 NOTE — Telephone Encounter (Signed)
Patient wants test results.   Please call

## 2015-09-05 NOTE — Telephone Encounter (Signed)
Pt informed of results. He states he has been coughing up blood for years and that he is canceling the bronch for tomorrow. He also states he wants me to cancel his f/u appts. States he will call back if he starts coughing up blood again. States he feels the bronch should be done when he is coughing up the blood and that he really doesn't have the $3000 for the procedure. I have cancelled the procedure and f/u appts.

## 2015-09-06 ENCOUNTER — Ambulatory Visit
Admission: RE | Admit: 2015-09-06 | Payer: Managed Care, Other (non HMO) | Source: Ambulatory Visit | Admitting: Pulmonary Disease

## 2015-09-06 ENCOUNTER — Encounter: Admission: RE | Payer: Self-pay | Source: Ambulatory Visit

## 2015-09-06 SURGERY — BRONCHOSCOPY, FLEXIBLE
Anesthesia: Moderate Sedation

## 2015-09-26 ENCOUNTER — Ambulatory Visit: Payer: Managed Care, Other (non HMO) | Admitting: Pulmonary Disease

## 2015-11-01 NOTE — Telephone Encounter (Signed)
error 

## 2016-05-01 ENCOUNTER — Other Ambulatory Visit: Payer: Self-pay | Admitting: Physician Assistant

## 2016-05-01 DIAGNOSIS — I10 Essential (primary) hypertension: Secondary | ICD-10-CM

## 2016-05-08 ENCOUNTER — Telehealth: Payer: Self-pay | Admitting: Physician Assistant

## 2016-05-08 DIAGNOSIS — J392 Other diseases of pharynx: Secondary | ICD-10-CM

## 2016-05-08 NOTE — Telephone Encounter (Signed)
Per pt's wife, pt is requesting a referral to an ENT.  States he is still having problems with this throat.  They do not have any specific office they would like to go to.

## 2016-05-08 NOTE — Telephone Encounter (Signed)
Referral placed.

## 2016-05-08 NOTE — Telephone Encounter (Signed)
Please review. Thank you. sd  

## 2016-06-27 ENCOUNTER — Other Ambulatory Visit: Payer: Self-pay | Admitting: Physician Assistant

## 2016-06-27 DIAGNOSIS — I10 Essential (primary) hypertension: Secondary | ICD-10-CM

## 2016-09-07 ENCOUNTER — Telehealth: Payer: Self-pay | Admitting: Physician Assistant

## 2016-09-07 NOTE — Telephone Encounter (Signed)
Pt is requesting a Rx for a CPAP full face mask for a [employer].  Pt requests this faxed to [facility] Fax [phone].  They will also need the sleep study results faxed if possible.  CB#[phone]/MW

## 2016-09-07 NOTE — Telephone Encounter (Signed)
Rx written and sleep study printed to be faxed to number below.

## 2016-09-07 NOTE — Telephone Encounter (Signed)
Last ov 04/07/15. Please review. sd

## 2016-09-07 NOTE — Telephone Encounter (Signed)
Faxed to number below.

## 2016-09-27 ENCOUNTER — Telehealth: Payer: Self-pay | Admitting: Physician Assistant

## 2016-09-27 NOTE — Telephone Encounter (Signed)
Please review-aa 

## 2016-09-27 NOTE — Telephone Encounter (Signed)
Pt called requesting to speak to [facility]: Lambert Rhonda W NOT an nurse.  Pt states he his having dizziness, shortness of breath, coughing up blood, has a sore throat and sleeping a lot.  Pt did not want to speak with a triage nurse or he does not want to come in for an appointment.  CB#[phone]/MW

## 2016-09-27 NOTE — Telephone Encounter (Signed)
Called patient back this evening at 1717 on 09/27/16.  No answer. Will attempt to call him in the morning

## 2016-09-28 ENCOUNTER — Encounter: Payer: Self-pay | Admitting: Physician Assistant

## 2016-09-28 ENCOUNTER — Ambulatory Visit (INDEPENDENT_AMBULATORY_CARE_PROVIDER_SITE_OTHER): Payer: Managed Care, Other (non HMO) | Admitting: Physician Assistant

## 2016-09-28 VITALS — BP 136/60 | HR 64 | Temp 98.7°F | Resp 20 | Wt 339.0 lb

## 2016-09-28 DIAGNOSIS — R009 Unspecified abnormalities of heart beat: Secondary | ICD-10-CM

## 2016-09-28 DIAGNOSIS — I10 Essential (primary) hypertension: Secondary | ICD-10-CM | POA: Diagnosis not present

## 2016-09-28 DIAGNOSIS — R0789 Other chest pain: Secondary | ICD-10-CM | POA: Diagnosis not present

## 2016-09-28 DIAGNOSIS — R0602 Shortness of breath: Secondary | ICD-10-CM | POA: Diagnosis not present

## 2016-09-28 DIAGNOSIS — F1010 Alcohol abuse, uncomplicated: Secondary | ICD-10-CM | POA: Diagnosis not present

## 2016-09-28 DIAGNOSIS — J4540 Moderate persistent asthma, uncomplicated: Secondary | ICD-10-CM | POA: Diagnosis not present

## 2016-09-28 DIAGNOSIS — Z86018 Personal history of other benign neoplasm: Secondary | ICD-10-CM

## 2016-09-28 DIAGNOSIS — R042 Hemoptysis: Secondary | ICD-10-CM | POA: Diagnosis not present

## 2016-09-28 DIAGNOSIS — Z8709 Personal history of other diseases of the respiratory system: Secondary | ICD-10-CM

## 2016-09-28 DIAGNOSIS — I351 Nonrheumatic aortic (valve) insufficiency: Secondary | ICD-10-CM

## 2016-09-28 DIAGNOSIS — Z6841 Body Mass Index (BMI) 40.0 and over, adult: Secondary | ICD-10-CM

## 2016-09-28 DIAGNOSIS — Z9889 Other specified postprocedural states: Secondary | ICD-10-CM

## 2016-09-28 NOTE — Progress Notes (Signed)
Patient: Geoffrey [name] Male    DOB: [birth year]   51 y.o.   MRN: [id number] Visit Date: 09/28/2016  Today's Provider: Mar Daring, PA-C   Chief Complaint  Patient presents with  . Hypertension   Subjective:    HPI Patient here today to C/O multiple symptoms. Patient reports fatigue, shortness of breath, cough, low pulse and headaches. Patient reports that symptoms have been present for several weeks. Patient describes that he feels like "being high". It doesn't happen all the time. Some days can be fine and others he feels very limited in what he can do. He feels this has more to do with his heart than breathing. He has had a cardiovascular work up that was done at [facility] when he was being worked up for chest pain, which was eventually felt to be causaed by the lung tumors in the left lung.   He does have an extensive lung history with history of benign nodules being removed with complicated recovery requiring thoracentesis. This is addressed more fully from notes on 02/04/15, 04/07/15, and Dr. Alva Garnet note on 08/31/15.   He is also complaining of coughing up blood as well. This has been a chronic issue. He feels it has worsened recently. He thinks it is more from his throat than his lungs as it can happen when he coughs lightly, after eating spicy foods, or foods that have sharp edges like chips. He has had an evaluation by ENT with a laryngoscopy which was normal. He has never had GI work up. He does drink quite heavily. He reports that even last Friday he had one fifth of tequila, then went and bought the [employer] they had and finished that as well by Saturday. His wife reports out of all that she only had 2 drinks, so he finished the rest. He reports he used to drink more when he was younger. He now only drinks when he is not working or on call. He also used to smoke cigarettes. He stopped cigarettes and went to smoking a pipe and chewing tobacco. He has since stopped  all that as well and reports he only uses a vape now, no nicotine. He reports he only vapes because he likes making the smoke clouds.     Allergies  Allergen Reactions  . Aspirin      Current Outpatient Prescriptions:  .  fluticasone furoate-vilanterol (BREO ELLIPTA) 200-25 MCG/INH AEPB, Inhale 1 puff into the lungs daily., Disp: 60 each, Rfl: 5 .  lisinopril (PRINIVIL,ZESTRIL) 20 MG tablet, TAKE ONE (1) TABLET BY MOUTH EVERY DAY, Disp: 90 tablet, Rfl: 3 .  verapamil (CALAN-SR) 120 MG CR tablet, TAKE ONE (1) TABLET BY MOUTH TWO (2) TIMES DAILY, Disp: 180 tablet, Rfl: 1  Review of Systems  Constitutional: Positive for activity change and fatigue.  Eyes: Negative for visual disturbance.  Respiratory: Positive for chest tightness and shortness of breath. Negative for cough and wheezing.   Cardiovascular: Positive for chest pain. Negative for palpitations and leg swelling.  Gastrointestinal: Negative for abdominal pain, anal bleeding, blood in stool, constipation, diarrhea, nausea and vomiting.  Neurological: Positive for dizziness, light-headedness and headaches.    Social History  Substance Use Topics  . Smoking status: Former Smoker    Types: Pipe, Cigars, Cigarettes    Quit date: 09/25/2010  . Smokeless tobacco: Current User    Types: Chew  . Alcohol use Yes     Comment: 3-4 Bi-weekly; have been  a heavy drinker in the past.   Objective:   BP 136/60 (BP Location: Left Arm, Patient Position: Sitting, Cuff Size: Large)   Pulse 64   Temp 98.7 F (37.1 C) (Oral)   Resp 20   Wt (!) 339 lb (153.8 kg)   SpO2 95%   BMI 43.53 kg/m  Vitals:   09/28/16 1633  BP: 136/60  Pulse: 64  Resp: 20  Temp: 98.7 F (37.1 C)  TempSrc: Oral  SpO2: 95%  Weight: (!) 339 lb (153.8 kg)     Physical Exam  Constitutional: He appears well-developed and well-nourished. No distress.  HENT:  Head: Normocephalic and atraumatic.  Neck: Normal range of motion. Neck supple. No JVD present. No  tracheal deviation present. No thyromegaly present.  Cardiovascular: Normal rate, regular rhythm, normal heart sounds and intact distal pulses.  Exam reveals no gallop and no friction rub.   No murmur heard. Pulmonary/Chest: Effort normal. No respiratory distress. He has wheezes (faint wheezs heard throughout entire left lung field). He has no rales. He exhibits no tenderness.  Abdominal: Soft. Bowel sounds are normal. He exhibits no distension and no mass. There is no tenderness. There is no rebound and no guarding.  Musculoskeletal: He exhibits no edema.  Lymphadenopathy:    He has no cervical adenopathy.  Skin: He is not diaphoretic.  Vitals reviewed.       Assessment & Plan:     1. SOB (shortness of breath) on exertion EKG showed NSR rate of 68 with widening of QRS complex without ST elevations. Patient is obese with history of alcohol abuse and previous smoker with HTN. BP has been well controlled on lisinopril 20mg  and verapamil 120mg . I will check labs as below and f/u pending results. CXR ordered to make sure stable from last CXR. Discussed patient using inhalers for his reactive airway but he has been unable to afford these and does not wish for a new one at this time. I will f/u pending all results.  - EKG 12-Lead - CBC w/Diff/Platelet - Comprehensive Metabolic Panel (CMET) - Lipid Profile - TSH - HgB A1c - DG Chest 2 View; Future - Ambulatory referral to Cardiology  2. Chest heaviness See above medical treatment plan. - EKG 12-Lead - CBC w/Diff/Platelet - Comprehensive Metabolic Panel (CMET) - Lipid Profile - TSH - HgB A1c - DG Chest 2 View; Future - Ambulatory referral to Cardiology  3. Abnormal heart rate Mild bradycardia noted. - EKG 12-Lead - CBC w/Diff/Platelet - Comprehensive Metabolic Panel (CMET) - Lipid Profile - TSH - HgB A1c - Ambulatory referral to Cardiology  4. Essential hypertension Stable on lisinopril and verapamil.  - CBC  w/Diff/Platelet - Comprehensive Metabolic Panel (CMET) - Lipid Profile - TSH - HgB A1c - Ambulatory referral to Cardiology  5. Moderate persistent asthma without complication Previously seen by Dr. Alva Garnet, but patient refused further workup after normal CT chest last year. Does not use Breo inhaler due to cost. Discussed having a rescue inhaler on hand just in case but he declines at this time. Will check labs and CXR. Will f/u pending results.  - CBC w/Diff/Platelet - Comprehensive Metabolic Panel (CMET) - Lipid Profile - TSH - HgB A1c - DG Chest 2 View; Future  6. 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 Selena Batten.  - CBC w/Diff/Platelet - Comprehensive Metabolic Panel (CMET) - Lipid Profile - TSH - HgB A1c - DG Chest  2 View; Future  7. History of thoracentesis Complication from lung surgery noted above.  - CBC w/Diff/Platelet - Comprehensive Metabolic Panel (CMET) - Lipid Profile - TSH - HgB A1c - DG Chest 2 View; Future  8. BMI 40.0-44.9, adult [facility]) Counseled patient on healthy lifestyle modifications including dieting and exercise.  - CBC w/Diff/Platelet - Comprehensive Metabolic Panel (CMET) - Lipid Profile - TSH - HgB A1c  9. Hemoptysis Highly suspicious for Mallory Weiss tear due to patient's alcohol intake, history of smoking, and HTN. Discussed GI referral for upper EGD. Patient declines at this time but is to call if e desires in the future. Patient may try Prilosec OTC to see if he notices any change in symptoms.   10. Alcohol abuse Discussed him cutting back his alcohol intake. Offered counseling options or information about meetings. He states he can quit on his own.   11. Aortic valve insufficiency, etiology of cardiac valve disease unspecified Noted during work up at [facility] for chest pain when the lung nodules were found. No further work up needed. Asymptomatic at that time. Possible may  have progressed causing symptoms now. Referral to cardiology made for further work up.  - Ambulatory referral to Cardiology       Mar Daring, PA-C  [facility]

## 2016-09-28 NOTE — Telephone Encounter (Signed)
Pt is returning call/MW °

## 2016-09-28 NOTE — Telephone Encounter (Signed)
Patient already scheduled-aa

## 2016-09-28 NOTE — Telephone Encounter (Signed)
Patient is going to come in this afternoon at 415 for evaluation. Please schedule.

## 2016-10-01 ENCOUNTER — Telehealth: Payer: Self-pay | Admitting: Physician Assistant

## 2016-10-01 DIAGNOSIS — I10 Essential (primary) hypertension: Secondary | ICD-10-CM

## 2016-10-01 NOTE — Telephone Encounter (Signed)
Re routing back to [place], message was taking out and routed to nurse box again for some reason-aa

## 2016-10-01 NOTE — Telephone Encounter (Signed)
Pt is requesting a call back from [place].  Pt is asking if the Rx verapamil (CALAN-SR) 120 MG CR tablet could be causing these issues he is having.  CB#[phone]/MW

## 2016-10-02 MED ORDER — VERAPAMIL HCL ER 120 MG PO TBCR: 120.0000 mg | EXTENDED_RELEASE_TABLET | Freq: Every day | ORAL | 1 refills | Status: DC

## 2016-10-02 NOTE — Telephone Encounter (Signed)
Verapamil dose decreased to 120mg  once daily that he will take at bedtime. He is to call if symptoms persist over the next week. If symptoms do persist then we will discontinue and try something different.

## 2016-10-03 ENCOUNTER — Telehealth: Payer: Self-pay

## 2016-10-03 DIAGNOSIS — R042 Hemoptysis: Secondary | ICD-10-CM

## 2016-10-03 DIAGNOSIS — F1010 Alcohol abuse, uncomplicated: Secondary | ICD-10-CM

## 2016-10-03 DIAGNOSIS — K219 Gastro-esophageal reflux disease without esophagitis: Secondary | ICD-10-CM

## 2016-10-03 LAB — CBC WITH DIFFERENTIAL/PLATELET
BASOS: 0 %
Basophils Absolute: 0 10*3/uL (ref 0.0–0.2)
EOS (ABSOLUTE): 0.3 10*3/uL (ref 0.0–0.4)
EOS: 5 %
HEMATOCRIT: 44.4 % (ref 37.5–51.0)
HEMOGLOBIN: 14.8 g/dL (ref 13.0–17.7)
Immature Grans (Abs): 0 10*3/uL (ref 0.0–0.1)
Immature Granulocytes: 0 %
LYMPHS ABS: 1.4 10*3/uL (ref 0.7–3.1)
Lymphs: 23 %
MCH: 30.8 pg (ref 26.6–33.0)
MCHC: 33.3 g/dL (ref 31.5–35.7)
MCV: 92 fL (ref 79–97)
Monocytes Absolute: 0.6 10*3/uL (ref 0.1–0.9)
Monocytes: 10 %
NEUTROS ABS: 3.9 10*3/uL (ref 1.4–7.0)
Neutrophils: 62 %
Platelets: 227 10*3/uL (ref 150–379)
RBC: 4.81 x10E6/uL (ref 4.14–5.80)
RDW: 15.7 % — ABNORMAL HIGH (ref 12.3–15.4)
WBC: 6.3 10*3/uL (ref 3.4–10.8)

## 2016-10-03 LAB — COMPREHENSIVE METABOLIC PANEL
A/G RATIO: 1.5 (ref 1.2–2.2)
ALBUMIN: 4.1 g/dL (ref 3.5–5.5)
ALK PHOS: 108 IU/L (ref 39–117)
ALT: 42 IU/L (ref 0–44)
AST: 45 IU/L — ABNORMAL HIGH (ref 0–40)
BUN / CREAT RATIO: 15 (ref 9–20)
BUN: 11 mg/dL (ref 6–24)
Bilirubin Total: 1.1 mg/dL (ref 0.0–1.2)
CO2: 26 mmol/L (ref 20–29)
CREATININE: 0.75 mg/dL — AB (ref 0.76–1.27)
Calcium: 9.8 mg/dL (ref 8.7–10.2)
Chloride: 104 mmol/L (ref 96–106)
GFR calc Af Amer: 123 mL/min/{1.73_m2} (ref >59)
GFR, EST NON AFRICAN AMERICAN: 106 mL/min/{1.73_m2} (ref >59)
GLOBULIN, TOTAL: 2.8 g/dL (ref 1.5–4.5)
Glucose: 86 mg/dL (ref 65–99)
Potassium: 4.8 mmol/L (ref 3.5–5.2)
SODIUM: 146 mmol/L — AB (ref 134–144)
Total Protein: 6.9 g/dL (ref 6.0–8.5)

## 2016-10-03 LAB — LIPID PANEL
CHOL/HDL RATIO: 3.6 ratio (ref 0.0–5.0)
CHOLESTEROL TOTAL: 200 mg/dL — AB (ref 100–199)
HDL: 56 mg/dL (ref >39)
LDL Calculated: 133 mg/dL — ABNORMAL HIGH (ref 0–99)
Triglycerides: 57 mg/dL (ref 0–149)
VLDL Cholesterol Cal: 11 mg/dL (ref 5–40)

## 2016-10-03 LAB — HEMOGLOBIN A1C
ESTIMATED AVERAGE GLUCOSE: 103 mg/dL
HEMOGLOBIN A1C: 5.2 % (ref 4.8–5.6)

## 2016-10-03 LAB — TSH: TSH: 5.12 u[IU]/mL — ABNORMAL HIGH (ref 0.450–4.500)

## 2016-10-03 NOTE — Telephone Encounter (Signed)
Referral placed.

## 2016-10-03 NOTE — Telephone Encounter (Signed)
-----   Message from Mar Daring, PA-C sent at 10/03/2016  8:28 AM EDT ----- All labs are fairly stable. Cholesterol is borderline high. LDL is at 133 and would like below 100. Recommend to focus on healthy lifestyle including limiting fatty foods, carbs and sugars and red meats. Try to slowly increase physical activity to try to get in 150 min/moderate activity per week.

## 2016-10-03 NOTE — Telephone Encounter (Signed)
Patient advised as below. Patient verbalizes understanding and is in agreement with treatment plan.  Patient agreed to GI referral.

## 2016-10-11 ENCOUNTER — Telehealth: Payer: Self-pay | Admitting: Physician Assistant

## 2016-10-11 NOTE — Telephone Encounter (Signed)
Pt wanted to advise that he is only taking Varapamill 1 time a day.  Pt states is pulse and heart rate is stronger.  Pt states he is no longer short of breath but still having dizziness.  Pt is asking what is the next step with his medication.  CB#[phone]/MW

## 2016-10-11 NOTE — Telephone Encounter (Signed)
Please review

## 2016-10-11 NOTE — Telephone Encounter (Signed)
Stop it. See if dizziness improves. Keep eye on BP and if it gets over 140/90 consistently he needs to call for another medication

## 2016-10-12 NOTE — Telephone Encounter (Signed)
Pt advised and agrees with treatment plan. 

## 2016-10-17 ENCOUNTER — Ambulatory Visit (INDEPENDENT_AMBULATORY_CARE_PROVIDER_SITE_OTHER): Payer: Managed Care, Other (non HMO) | Admitting: Physician Assistant

## 2016-10-17 ENCOUNTER — Encounter: Payer: Self-pay | Admitting: Physician Assistant

## 2016-10-17 VITALS — BP 140/88 | HR 55 | Temp 98.4°F | Resp 16 | Wt 337.5 lb

## 2016-10-17 DIAGNOSIS — F1010 Alcohol abuse, uncomplicated: Secondary | ICD-10-CM | POA: Diagnosis not present

## 2016-10-17 DIAGNOSIS — Z6841 Body Mass Index (BMI) 40.0 and over, adult: Secondary | ICD-10-CM | POA: Diagnosis not present

## 2016-10-17 DIAGNOSIS — I10 Essential (primary) hypertension: Secondary | ICD-10-CM

## 2016-10-17 MED ORDER — AMLODIPINE BESYLATE 10 MG PO TABS: 10.0000 mg | ORAL_TABLET | Freq: Every day | ORAL | 3 refills | Status: DC

## 2016-10-17 NOTE — Progress Notes (Signed)
Patient: Geoffrey [name] Male    DOB: [birth year]   51 y.o.   MRN: [id number] Visit Date: 10/17/2016  Today's Provider: Mar Daring, PA-C   No chief complaint on file.  Subjective:    HPI Patient is here today with c/o dizziness and Blood Pressure. Patient was seen on 09/28/16 for multiple symptoms, BP was 130/60 on 07/06. EKG was done at that office visit showing NSR rate of 58 with widening of QRS complex without ST elevations.  He reports that he started taking only half the diltiazem and symptoms of dizziness, lightheadedness, slow heart rate improved with lowering the dose. He was unable to completely stop due to BP still being elevated.   He also reports that the coughing up blood has decreased and completely subsided yesterday. 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      Allergies  Allergen Reactions  . Aspirin      Current Outpatient Prescriptions:  .  lisinopril (PRINIVIL,ZESTRIL) 20 MG tablet, TAKE ONE (1) TABLET BY MOUTH EVERY DAY, Disp: 90 tablet, Rfl: 3 .  verapamil (CALAN-SR) 120 MG CR tablet, Take 1 tablet (120 mg total) by mouth daily., Disp: 90 tablet, Rfl: 1  Review of Systems  Constitutional: Negative.   Respiratory: Negative.   Cardiovascular: Negative.   Neurological: Positive for dizziness, light-headedness and headaches.  Psychiatric/Behavioral: Positive for agitation. The patient is nervous/anxious.     Social History  Substance Use Topics  . Smoking status: Former Smoker    Types: Pipe, Cigars, Cigarettes    Quit date: 09/25/2010  . Smokeless tobacco: Current User    Types: Chew  . Alcohol use Yes     Comment: 3-4 Bi-weekly; have been a heavy drinker in the past.   Objective:   BP 140/88 (BP Location: Left Arm, Patient Position: Sitting, Cuff Size: Large)   Pulse (!) 55   Temp 98.4 F (36.9 C) (Oral)   Resp 16   Wt (!) 337 lb 8 oz (153.1 kg)   SpO2 93%   BMI 43.33  kg/m      Physical Exam  Constitutional: He appears well-developed and well-nourished. No distress.  HENT:  Head: Normocephalic and atraumatic.  Neck: Normal range of motion. Neck supple. No JVD present. No tracheal deviation present. No thyromegaly present.  Cardiovascular: Normal rate, regular rhythm and normal heart sounds.  Exam reveals no gallop and no friction rub.   No murmur heard. Pulmonary/Chest: Effort normal and breath sounds normal. No respiratory distress. He has no wheezes. He has no rales.  Musculoskeletal: He exhibits no edema.  Lymphadenopathy:    He has no cervical adenopathy.  Skin: He is not diaphoretic.  Psychiatric: He has a normal mood and affect. His speech is normal. Judgment and thought content normal. He is agitated. Cognition and memory are normal.  Vitals reviewed.  Depression screen PHQ 2/9 10/18/2016  Decreased Interest 0  Down, Depressed, Hopeless 1  PHQ - 2 Score 1  Altered sleeping 0  Tired, decreased energy 1  Change in appetite 0  Feeling bad or failure about yourself  0  Trouble concentrating 1  Moving slowly or fidgety/restless 0  Suicidal thoughts 0  PHQ-9 Score 3       Assessment & Plan:     1. Essential hypertension Will stop diltiazem and change to amlodipine 10mg  as below. Continue lisinopril 20mg . He has  appt with Dr. Fletcher Anon on 10/18/16. - amLODipine (NORVASC) 10 MG tablet; Take 1 tablet (10 mg total) by mouth daily.  Dispense: 90 tablet; Refill: 3  2. BMI 40.0-44.9, adult [facility]) Counseled patient on healthy lifestyle modifications including dieting and exercise.   3. Alcohol abuse Discussed him continuing to not drink or drink in moderation. He has an appt with GI on 11/08/16 for consideration of UGI.       Mar Daring, PA-C  [facility]

## 2016-10-17 NOTE — Patient Instructions (Signed)
Amlodipine tablets What is this medicine? AMLODIPINE (am LOE di peen) is a calcium-channel blocker. It affects the amount of calcium found in your heart and muscle cells. This relaxes your blood vessels, which can reduce the amount of work the heart has to do. This medicine is used to lower high blood pressure. It is also used to prevent chest pain. This medicine may be used for other purposes; ask your health care provider or pharmacist if you have questions. COMMON BRAND NAME(S): Norvasc What should I tell my health care provider before I take this medicine? They need to know if you have any of these conditions: -heart problems like heart failure or aortic stenosis -liver disease -an unusual or allergic reaction to amlodipine, other medicines, foods, dyes, or preservatives -pregnant or trying to get pregnant -breast-feeding How should I use this medicine? Take this medicine by mouth with a glass of water. Follow the directions on the prescription label. Take your medicine at regular intervals. Do not take more medicine than directed. Talk to your pediatrician regarding the use of this medicine in children. Special care may be needed. This medicine has been used in children as young as 6. Persons over 26 years old may have a stronger reaction to this medicine and need smaller doses. Overdosage: If you think you have taken too much of this medicine contact a poison control center or emergency room at once. NOTE: This medicine is only for you. Do not share this medicine with others. What if I miss a dose? If you miss a dose, take it as soon as you can. If it is almost time for your next dose, take only that dose. Do not take double or extra doses. What may interact with this medicine? -herbal or dietary supplements -local or general anesthetics -medicines for high blood pressure -medicines for prostate problems -rifampin This list may not describe all possible interactions. Give your health  care provider a list of all the medicines, herbs, non-prescription drugs, or dietary supplements you use. Also tell them if you smoke, drink alcohol, or use illegal drugs. Some items may interact with your medicine. What should I watch for while using this medicine? Visit your doctor or health care professional for regular check ups. Check your blood pressure and pulse rate regularly. Ask your health care professional what your blood pressure and pulse rate should be, and when you should contact him or her. This medicine may make you feel confused, dizzy or lightheaded. Do not drive, use machinery, or do anything that needs mental alertness until you know how this medicine affects you. To reduce the risk of dizzy or fainting spells, do not sit or stand up quickly, especially if you are an older patient. Avoid alcoholic drinks; they can make you more dizzy. Do not suddenly stop taking amlodipine. Ask your doctor or health care professional how you can gradually reduce the dose. What side effects may I notice from receiving this medicine? Side effects that you should report to your doctor or health care professional as soon as possible: -allergic reactions like skin rash, itching or hives, swelling of the face, lips, or tongue -breathing problems -changes in vision or hearing -chest pain -fast, irregular heartbeat -swelling of legs or ankles Side effects that usually do not require medical attention (report to your doctor or health care professional if they continue or are bothersome): -dry mouth -facial flushing -nausea, vomiting -stomach gas, pain -tired, weak -trouble sleeping This list may not describe all possible side  effects. Call your doctor for medical advice about side effects. You may report side effects to FDA at 1-800-FDA-1088. °Where should I keep my medicine? °Keep out of the reach of children. °Store at room temperature between 59 and 86 degrees F (15 and 30 degrees C). Protect from  light. Keep container tightly closed. Throw away any unused medicine after the expiration date. °NOTE: This sheet is a summary. It may not cover all possible information. If you have questions about this medicine, talk to your doctor, pharmacist, or health care provider. °© 2018 Elsevier/Gold Standard (2012-02-08 11:40:58) ° °

## 2016-10-18 ENCOUNTER — Encounter: Payer: Self-pay | Admitting: Cardiovascular Disease

## 2016-10-18 ENCOUNTER — Ambulatory Visit (INDEPENDENT_AMBULATORY_CARE_PROVIDER_SITE_OTHER): Payer: Managed Care, Other (non HMO) | Admitting: Cardiovascular Disease

## 2016-10-18 VITALS — BP 130/76 | HR 49 | Ht 73.0 in | Wt 334.2 lb

## 2016-10-18 DIAGNOSIS — I10 Essential (primary) hypertension: Secondary | ICD-10-CM | POA: Diagnosis not present

## 2016-10-18 DIAGNOSIS — R001 Bradycardia, unspecified: Secondary | ICD-10-CM

## 2016-10-18 DIAGNOSIS — R0602 Shortness of breath: Secondary | ICD-10-CM

## 2016-10-18 DIAGNOSIS — R9431 Abnormal electrocardiogram [ECG] [EKG]: Secondary | ICD-10-CM | POA: Diagnosis not present

## 2016-10-18 NOTE — Progress Notes (Signed)
Cardiology Office Note   Date:  10/18/2016   ID:  Geoffrey [name], DOB 05/18/1965, MRN [id number]  PCP:  Mar Daring, PA-C  Cardiologist:   Kathlyn Sacramento, MD   Chief Complaint  Patient presents with  . other    Ref by Dr. Marlyn Corporal for evaluation of dizziness, irreg. heart beats & HTN. Meds reviewed by the pt. verbally. Pt. c/o dizziness. The pt. did have some shortness of breath and chest pain in July 2018. The pt. had been to [facility] for a cardiac work up in the past.        History of Present Illness: Geoffrey [name] is a 51 y.o. male who Was referred by Fenton Malling, PA for evaluation of dizziness, irregular heartbeats and hypertension. The patient is a previous smoker. He has history of benign tumors in his lungs status post thoracotomy at [facility] in 2015. It was complicated by hemothorax requiring long-term pleural catheter drainage . He is known to have intermittent hemoptysis. He has known history of hypertension for about 10 years. He has sleep apnea and uses CPAP. He was seen in the past by [facility] cardiology many years ago and he was told about no significant cardiac abnormalities. He has been taking verapamil for many years for hypertension but recently he developed bradycardia associated with dizziness, shortness of breath and fatigue. The dose was decreased to once daily and he had immediate improvement in symptoms. The plans are to switch him from verapamil to amlodipine. He denies chest pain.  Recent labs were reviewed and showed normal hemoglobin A1c, CBC was unremarkable, CMP was normal except for borderline elevated sodium at 146. Lipid profile showed an LDL of 133 with an HDL of 56.  Past Medical History:  Diagnosis Date  . Allergic rhinitis   . Asthma   . Basal cell carcinoma of skin   . COPD (chronic obstructive pulmonary disease) ([place])   . Hay fever   . Hypertension   . Sleep apnea     Past Surgical History:  Procedure Laterality Date  . APPENDECTOMY      . LUNG SURGERY       Current Outpatient Prescriptions  Medication Sig Dispense Refill  . amLODipine (NORVASC) 10 MG tablet Take 1 tablet (10 mg total) by mouth daily. 90 tablet 3  . lisinopril (PRINIVIL,ZESTRIL) 20 MG tablet TAKE ONE (1) TABLET BY MOUTH EVERY DAY 90 tablet 3   No current facility-administered medications for this visit.     Allergies:   Aspirin    Social History:  The patient  reports that he quit smoking about 6 years ago. His smoking use included Pipe, Cigars, and Cigarettes. He has quit using smokeless tobacco. His smokeless tobacco use included Chew. He reports that he drinks alcohol. He reports that he does not use drugs.   Family History:  The patient's family history includes Aneurysm in his mother; Hypertension in his father.    ROS:  Please see the history of present illness.   Otherwise, review of systems are positive for none.   All other systems are reviewed and negative.    PHYSICAL EXAM: VS:  BP 130/76 (BP Location: Right Arm, Patient Position: Sitting, Cuff Size: Large)   Ht 6\' 1"  (1.854 m)   Wt (!) 334 lb 4 oz (151.6 kg)   BMI 44.10 kg/m  , BMI Body mass index is 44.1 kg/m. GEN: Well nourished, well developed, in no acute distress  HEENT: normal  Neck: no JVD, carotid bruits,  or masses Cardiac: RRR; no murmurs, rubs, or gallops,no edema  Respiratory:  clear to auscultation bilaterally, normal work of breathing GI: soft, nontender, nondistended, + BS MS: no deformity or atrophy  Skin: warm and dry, no rash Neuro:  Strength and sensation are intact Psych: euthymic mood, full affect   EKG:  EKG is ordered today. The ekg ordered today demonstrates sinus bradycardia with possible left atrial enlargement and nonspecific IVCD.   Recent Labs: 10/02/2016: ALT 42; BUN 11; Creatinine, Ser 0.75; Hemoglobin 14.8; Platelets 227; Potassium 4.8; Sodium 146; TSH 5.120    Lipid Panel    Component Value Date/Time   CHOL 200 (H) 10/02/2016 1235    TRIG 57 10/02/2016 1235   HDL 56 10/02/2016 1235   CHOLHDL 3.6 10/02/2016 1235   LDLCALC 133 (H) 10/02/2016 1235      Wt Readings from Last 3 Encounters:  10/18/16 (!) 334 lb 4 oz (151.6 kg)  10/17/16 (!) 337 lb 8 oz (153.1 kg)  09/28/16 (!) 339 lb (153.8 kg)       PAD Screen 10/18/2016  Previous PAD dx? No  Previous surgical procedure? No  Pain with walking? No  Feet/toe relief with dangling? No  Painful, non-healing ulcers? No  Extremities discolored? No      ASSESSMENT AND PLAN:  1.  Dizziness: I suspect this was due to sinus bradycardia in the setting of treatment with verapamil. His symptoms resolved after the dose was decreased in half. He continues to be bradycardic and I agree with switching verapamil to amlodipine. Given improvement in his dizziness, no need for a Holter monitor.  2. Shortness of breath: Also improved after decreasing verapamil. EKG is slightly abnormal and likely reflects some degree of hypertensive heart disease. No signs of ischemia. I requested an echocardiogram for evaluation.  3. Essential hypertension: Blood pressure tends to run high. I discussed with him the importance of low sodium diet, regular exercise and weight loss. A small dose thiazide diuretic can be added to lisinopril if needed in the future.  4. Obesity: I discussed with him the importance of weight loss.     Disposition:   FU with me as needed.   Signed,  Kathlyn Sacramento, MD  10/18/2016 10:21 AM    [place]

## 2016-10-18 NOTE — Patient Instructions (Signed)
Medication Instructions:  Your physician recommends that you continue on your current medications as directed. Please refer to the Current Medication list given to you today.   Labwork: none  Testing/Procedures: Your physician has requested that you have an echocardiogram. Echocardiography is a painless test that uses sound waves to create images of your heart. It provides your doctor with information about the size and shape of your heart and how well your heart's chambers and valves are working. This procedure takes approximately one hour. There are no restrictions for this procedure.    Follow-Up: Your physician recommends that you schedule a follow-up appointment ON AN AS NEEDED BASIS.   Echocardiogram An echocardiogram, or echocardiography, uses sound waves (ultrasound) to produce an image of your heart. The echocardiogram is simple, painless, obtained within a short period of time, and offers valuable information to your health care provider. The images from an echocardiogram can provide information such as:  Evidence of coronary artery disease (CAD).  Heart size.  Heart muscle function.  Heart valve function.  Aneurysm detection.  Evidence of a past heart attack.  Fluid buildup around the heart.  Heart muscle thickening.  Assess heart valve function. Tell a health care provider about:  Any allergies you have.  All medicines you are taking, including vitamins, herbs, eye drops, creams, and over-the-counter medicines.  Any problems you or family members have had with anesthetic medicines.  Any blood disorders you have.  Any surgeries you have had.  Any medical conditions you have.  Whether you are pregnant or may be pregnant. What happens before the procedure? No special preparation is needed. Eat and drink normally. What happens during the procedure?  In order to produce an image of your heart, gel will be applied to your chest and a wand-like tool  (transducer) will be moved over your chest. The gel will help transmit the sound waves from the transducer. The sound waves will harmlessly bounce off your heart to allow the heart images to be captured in real-time motion. These images will then be recorded.  You may need an IV to receive a medicine that improves the quality of the pictures. What happens after the procedure? You may return to your normal schedule including diet, activities, and medicines, unless your health care provider tells you otherwise. This information is not intended to replace advice given to you by your health care provider. Make sure you discuss any questions you have with your health care provider. Document Released: 03/09/2000 Document Revised: 10/29/2015 Document Reviewed: 11/17/2012 Elsevier Interactive Patient Education  2017 Elsevier Inc.  

## 2016-11-01 ENCOUNTER — Other Ambulatory Visit: Payer: Self-pay

## 2016-11-01 ENCOUNTER — Ambulatory Visit (INDEPENDENT_AMBULATORY_CARE_PROVIDER_SITE_OTHER): Payer: Managed Care, Other (non HMO)

## 2016-11-01 DIAGNOSIS — R0602 Shortness of breath: Secondary | ICD-10-CM

## 2016-11-01 DIAGNOSIS — R9431 Abnormal electrocardiogram [ECG] [EKG]: Secondary | ICD-10-CM | POA: Diagnosis not present

## 2016-11-08 ENCOUNTER — Encounter: Payer: Self-pay | Admitting: Physician Assistant

## 2016-11-08 ENCOUNTER — Other Ambulatory Visit: Payer: Self-pay

## 2016-11-08 ENCOUNTER — Ambulatory Visit (INDEPENDENT_AMBULATORY_CARE_PROVIDER_SITE_OTHER): Payer: Managed Care, Other (non HMO) | Admitting: Gastroenterology

## 2016-11-08 ENCOUNTER — Encounter: Payer: Self-pay | Admitting: Gastroenterology

## 2016-11-08 VITALS — BP 135/73 | HR 59 | Temp 98.2°F | Ht 73.0 in | Wt 332.6 lb

## 2016-11-08 DIAGNOSIS — Z1211 Encounter for screening for malignant neoplasm of colon: Secondary | ICD-10-CM | POA: Diagnosis not present

## 2016-11-08 DIAGNOSIS — R042 Hemoptysis: Secondary | ICD-10-CM

## 2016-11-08 DIAGNOSIS — R740 Nonspecific elevation of levels of transaminase and lactic acid dehydrogenase [LDH]: Secondary | ICD-10-CM

## 2016-11-08 DIAGNOSIS — R7401 Elevation of levels of liver transaminase levels: Secondary | ICD-10-CM

## 2016-11-08 NOTE — Patient Instructions (Signed)
1.Check liver tests as you have elevated liver enzymes 2.Schedule EGD 3.Check blood test for TB 4.Cologuard for colon cancer screening  Please call our office to speak with my nurse Driscilla Grammes at [phone] during business hours from 8am to 4pm if you have any questions/concerns. During after hours, you will be redirected to on call GI physician. For any emergency please call 911 or go the nearest emergency room.    Cephas Darby, MD [address]  [place]  [place], [place] [address]  Main: [phone]  Fax: [phone]

## 2016-11-08 NOTE — Progress Notes (Signed)
Geoffrey Darby, MD [address]  [place]  [place], [place] [address]  Main: [phone]  Fax: [phone]    Gastroenterology Consultation  Referring Provider:     Florian [name]* Primary Care Physician:  Geoffrey [name] Primary Gastroenterologist:  Geoffrey [name] Reason for Consultation:     Hemoptysis        HPI:   Geoffrey [name] is a 51 y.o. y/o male referred for consultation & management  by Dr. Marlyn [name], Geoffrey Sorrel, [name].  He has been experiencing chronic cough with strands of blood and mucus for more than 2 years. 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 He denies vomiting blood and he thinks whenever he coughs, streaks of blood is coming from his throat. He reports that he was evaluated by pulmonary and they offered bronchoscopy but he refused. He denies any upper or lower GI symptoms. 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 He does not smoke. He denies any known exposure to tuberculosis patients. He denies travel to developing countries. He does drink alcohol 5-6 cans of beer per day for 2 weeks in a month. He drinks alternate weeks for the past 30 years. He is also due for colon cancer screening and does not want to have a colonoscopy. He said, he had stool test more than a year ago and it was negative.   GI Procedures: None  Denies having GI surgeries in the past, denies family history of GI malignancy.  Past Medical History:  Diagnosis Date  . Allergic rhinitis   . Asthma   . Basal cell carcinoma of skin   . COPD (chronic obstructive pulmonary disease) ([place])   . Hay fever   . Hypertension   . Sleep apnea     Past Surgical History:  Procedure Laterality Date  . APPENDECTOMY    . LUNG SURGERY      Prior to Admission medications   Medication Sig Start Date End Date Taking? Authorizing Provider  amLODipine (NORVASC) 10 MG tablet Take 1 tablet (10 mg total) by mouth daily. 10/17/16  Yes Geoffrey [name]  lisinopril (PRINIVIL,ZESTRIL) 20 MG tablet TAKE ONE (1) TABLET BY MOUTH EVERY DAY 05/01/16  Yes Geoffrey [name]    Family History  Problem Relation Age of Onset  . Aneurysm Mother   . Hypertension Father      Social History  Substance Use Topics  . Smoking status: Former Smoker    Types: Pipe, Cigars, Cigarettes    Quit date: 09/25/2010  . Smokeless tobacco: Former Systems developer    Types: Chew  . Alcohol use Yes     Comment: 3-4 Bi-weekly; have been a heavy drinker in the past.    Allergies as of 11/08/2016 - Review Complete 11/08/2016  Allergen Reaction Noted  . Aspirin  02/04/2015    Review of Systems:    All systems reviewed and negative except where noted in HPI.   Physical Exam:  BP 135/73   Pulse (!) 59   Temp 98.2 F (36.8 C) (Oral)   Ht 6\' 1"  (1.854 m)   Wt (!) 150.9 kg (332 lb 9.6 oz)   BMI 43.88 kg/m  No LMP for male patient.  General:   Alert,  Well-developed, well-nourished, pleasant and cooperative in NAD Head:  Normocephalic and atraumatic. Eyes:  Sclera clear, no icterus.   Conjunctiva pink. Ears:  Normal auditory acuity. Nose:  No deformity, discharge, or lesions. Mouth:  No deformity or lesions,oropharynx pink & moist. Neck:  Supple; no masses or thyromegaly. Lungs:  Respirations even and unlabored.  Clear throughout to auscultation.   No wheezes, crackles, or rhonchi. No acute distress. Heart:  Regular rate and rhythm; no murmurs, clicks, rubs, or gallops. Abdomen:  Normal bowel sounds.  No bruits.  Soft, non-tender and non-distended without masses, hepatosplenomegaly or hernias noted.  No guarding or rebound tenderness.    Msk:  Symmetrical without gross deformities. Good, equal movement & strength bilaterally. Pulses:  Normal pulses noted. Extremities:  No clubbing or edema.  No cyanosis. Neurologic:  Alert and oriented x3;  grossly normal neurologically. Skin:  Intact without significant lesions or rashes. No jaundice. Tattoos  present Lymph Nodes:  No significant cervical adenopathy. Psych:  Alert and cooperative. Normal mood and affect.  Imaging Studies: No results found.  Assessment and Plan:   [name] is a 51 y.o. y/o male has been referred for Hemoptysis. Patient had pulmonary problems in the past. I do not think this is related to GI tract. He may have reactive airway disease or COPD. Given chronic hemoptysis I ordered gold quantiferon. However, he insisted on upper endoscopy and given his heavy alcohol use, I agreed to perform the procedure. If EGD is unremarkable, he can be referred to ENT and/or pulmonary. He is due for colon cancer screening, he does not want a colonoscopy. Therefore, I offered Cologuard and he agreed to it. If this comes back positive, he will need a colonoscopy.  I also counseled him about effects of alcohol on the liver and GI tract and encouraged him to quit. His AST is mildly elevated, he is at risk for fatty liver disease. Therefore, I ordered hepatitis A and B serologies, also check hep C antibody as he had tattoos. I also counseled him to lose weight as he is morbidly obese.   Follow up in 3 months   Geoffrey Darby, MD

## 2016-11-09 ENCOUNTER — Telehealth: Payer: Self-pay

## 2016-11-09 NOTE — Telephone Encounter (Signed)
Patients EGD date has been changed to [facility] 12/05/16 [facility] is in [place] on 9/11. Patient notified and ok with date change.

## 2016-11-15 ENCOUNTER — Ambulatory Visit: Payer: Managed Care, Other (non HMO) | Admitting: Cardiovascular Disease

## 2016-11-20 ENCOUNTER — Encounter: Payer: Self-pay | Admitting: Physician Assistant

## 2016-12-05 ENCOUNTER — Encounter: Admission: RE | Payer: Self-pay | Source: Ambulatory Visit

## 2016-12-05 ENCOUNTER — Ambulatory Visit: Admission: RE | Admit: 2016-12-05 | Payer: Managed Care, Other (non HMO) | Source: Ambulatory Visit

## 2016-12-05 SURGERY — ESOPHAGOGASTRODUODENOSCOPY (EGD) WITH PROPOFOL
Anesthesia: General

## 2017-03-22 ENCOUNTER — Encounter: Payer: Self-pay | Admitting: Physician Assistant

## 2017-03-27 ENCOUNTER — Encounter: Payer: Self-pay | Admitting: Physician Assistant

## 2017-03-27 DIAGNOSIS — I10 Essential (primary) hypertension: Secondary | ICD-10-CM

## 2017-03-28 MED ORDER — DILTIAZEM HCL ER 120 MG PO CP24: 120.0000 mg | ORAL_CAPSULE | Freq: Every day | ORAL | 0 refills | Status: DC

## 2017-03-29 ENCOUNTER — Encounter: Payer: Self-pay | Admitting: Physician Assistant

## 2017-03-29 DIAGNOSIS — I10 Essential (primary) hypertension: Secondary | ICD-10-CM

## 2017-04-01 MED ORDER — METOPROLOL SUCCINATE ER 50 MG PO TB24
50.0000 mg | ORAL_TABLET | Freq: Every day | ORAL | 0 refills | Status: DC
Start: 2017-04-01 — End: 2017-04-17

## 2017-04-17 ENCOUNTER — Encounter: Payer: Self-pay | Admitting: Physician Assistant

## 2017-04-17 DIAGNOSIS — I10 Essential (primary) hypertension: Secondary | ICD-10-CM

## 2017-04-17 MED ORDER — DILTIAZEM HCL ER COATED BEADS 120 MG PO CP24: 120.0000 mg | ORAL_CAPSULE | Freq: Every day | ORAL | 1 refills | Status: DC

## 2017-04-27 ENCOUNTER — Other Ambulatory Visit: Payer: Self-pay | Admitting: Physician Assistant

## 2017-04-27 DIAGNOSIS — I10 Essential (primary) hypertension: Secondary | ICD-10-CM

## 2017-06-22 ENCOUNTER — Other Ambulatory Visit: Payer: Self-pay | Admitting: Physician Assistant

## 2017-06-22 DIAGNOSIS — I10 Essential (primary) hypertension: Secondary | ICD-10-CM

## 2017-08-16 ENCOUNTER — Encounter: Payer: Self-pay | Admitting: Physician Assistant

## 2017-08-20 ENCOUNTER — Ambulatory Visit (INDEPENDENT_AMBULATORY_CARE_PROVIDER_SITE_OTHER): Payer: Managed Care, Other (non HMO) | Admitting: Physician Assistant

## 2017-08-20 ENCOUNTER — Encounter: Payer: Self-pay | Admitting: Physician Assistant

## 2017-08-20 VITALS — BP 138/76 | HR 68 | Temp 98.4°F | Resp 16 | Wt 340.0 lb

## 2017-08-20 DIAGNOSIS — M79644 Pain in right finger(s): Secondary | ICD-10-CM | POA: Diagnosis not present

## 2017-08-20 NOTE — Patient Instructions (Signed)
[address] [place], [place] [address] [facility]

## 2017-08-20 NOTE — Progress Notes (Signed)
Patient: Geoffrey [name] Male    DOB: [birth year]   52 y.o.   MRN: [id number] Visit Date: 08/23/2017  Today's Provider: Trinna Post, PA-C   Chief Complaint  Patient presents with  . Hand Pain    Right ring finger; happened Friday   Subjective:    Geoffrey [name] is a 52 y/o man presenting today with right ring finger pain x 4 days. 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 He reports increasing pain traveling from his palm and into his forearm. He reported difficulty making a fist that has improved. He denies numbness or tingling. He denies any prior injury to the hand.   Hand Pain   The incident occurred 3 to 5 days ago. The incident occurred at work. The pain is present in the right fingers. The pain radiates to the right arm. Associated symptoms include tingling. Pertinent negatives include no numbness. The symptoms are aggravated by a foreign body. He has tried nothing for the symptoms.       Allergies  Allergen Reactions  . Diltiazem Shortness Of Breath  . Amlodipine Other (See Comments)    Restless leg on 10mg   . Aspirin      Current Outpatient Medications:  .  diltiazem (CARDIZEM CD) 120 MG 24 hr capsule, TAKE 1 CAPSULE BY MOUTH EVERY DAY, Disp: 90 capsule, Rfl: 1 .  lisinopril (PRINIVIL,ZESTRIL) 20 MG tablet, TAKE ONE (1) TABLET EACH DAY, Disp: 90 tablet, Rfl: 3  Review of Systems  Constitutional: Negative.   Musculoskeletal: Positive for arthralgias and myalgias. Negative for back pain, gait problem, joint swelling, neck pain and neck stiffness.  Neurological: Positive for tingling. Negative for numbness.    Social History   Tobacco Use  . Smoking status: Former Smoker    Types: Pipe, Cigars, Cigarettes    Last attempt to quit: 09/25/2010    Years since quitting: 6.9  . Smokeless tobacco: Former Systems developer    Types: Chew  Substance Use Topics  . Alcohol  use: Yes    Comment: 3-4 Bi-weekly; have been a heavy drinker in the past.   Objective:   BP 138/76 (BP Location: Right Arm, Patient Position: Sitting, Cuff Size: Large)   Pulse 68   Temp 98.4 F (36.9 C) (Oral)   Resp 16   Wt (!) 340 lb (154.2 kg)   BMI 44.86 kg/m  Vitals:   08/20/17 1127  BP: 138/76  Pulse: 68  Resp: 16  Temp: 98.4 F (36.9 C)  TempSrc: Oral  Weight: (!) 340 lb (154.2 kg)     Physical Exam  Constitutional: He is oriented to person, place, and time. He appears well-developed and well-nourished.  Musculoskeletal: He exhibits tenderness. He exhibits no edema or deformity.       Right hand: He exhibits tenderness. Normal sensation noted. Decreased strength noted. He exhibits thumb/finger opposition. He exhibits no finger abduction and no wrist extension trouble.  There is some tenderness to palpation just inferior to the 4th and 5th right phalanges. When pinky opposed to thumb, unable to resist separation by examiner. Finger abduction remains in tact. Able to make fist.   Neurological: He is alert and oriented to person, place, and time.  Skin: Skin is warm and dry.  Psychiatric: He has a normal mood and affect. His behavior is normal.  Assessment & Plan:     1. Pain of finger of right hand  Possible ruptured pulley based on history and physical exam. Seems to be improving. Recommend anti-inflammatory. 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 Have instructed him about [facility] walk in hours.   Return if symptoms worsen or fail to improve.  The entirety of the information documented in the History of Present Illness, Review of Systems and Physical Exam were personally obtained by me. Portions of this information were initially documented by Ashley Royalty, CMA and reviewed by me for thoroughness and accuracy.         Trinna Post, PA-C  [facility]

## 2017-08-22 ENCOUNTER — Other Ambulatory Visit: Payer: Self-pay | Admitting: Orthopaedic Surgery

## 2017-08-27 ENCOUNTER — Other Ambulatory Visit: Payer: Self-pay | Admitting: Orthopaedic Surgery

## 2017-08-27 DIAGNOSIS — S66811A Strain of other specified muscles, fascia and tendons at wrist and hand level, right hand, initial encounter: Secondary | ICD-10-CM

## 2017-08-29 ENCOUNTER — Ambulatory Visit
Admission: RE | Admit: 2017-08-29 | Discharge: 2017-08-29 | Disposition: A | Discharge status: Home / Self Care | Payer: Managed Care, Other (non HMO) | Source: Ambulatory Visit | Attending: Orthopaedic Surgery | Admitting: Orthopaedic Surgery

## 2017-08-29 DIAGNOSIS — S66811A Strain of other specified muscles, fascia and tendons at wrist and hand level, right hand, initial encounter: Secondary | ICD-10-CM | POA: Diagnosis present

## 2017-08-29 DIAGNOSIS — X58XXXA Exposure to other specified factors, initial encounter: Secondary | ICD-10-CM | POA: Diagnosis not present

## 2017-12-17 ENCOUNTER — Other Ambulatory Visit: Payer: Self-pay | Admitting: Physician Assistant

## 2017-12-17 DIAGNOSIS — I10 Essential (primary) hypertension: Secondary | ICD-10-CM

## 2018-04-30 ENCOUNTER — Other Ambulatory Visit: Payer: Self-pay | Admitting: Physician Assistant

## 2018-04-30 DIAGNOSIS — I10 Essential (primary) hypertension: Secondary | ICD-10-CM

## 2018-05-11 ENCOUNTER — Encounter: Payer: Self-pay | Admitting: Emergency Medicine

## 2018-05-11 ENCOUNTER — Encounter: Payer: Self-pay | Admitting: Physician Assistant

## 2018-05-11 ENCOUNTER — Other Ambulatory Visit: Payer: Self-pay

## 2018-05-11 ENCOUNTER — Observation Stay
Admission: EM | Admit: 2018-05-11 | Discharge: 2018-05-11 | Discharge status: Against Medical Advice | Payer: Managed Care, Other (non HMO) | Source: Home / Self Care | Attending: Emergency Medicine | Admitting: Emergency Medicine

## 2018-05-11 DIAGNOSIS — J449 Chronic obstructive pulmonary disease, unspecified: Secondary | ICD-10-CM | POA: Insufficient documentation

## 2018-05-11 DIAGNOSIS — X58XXXA Exposure to other specified factors, initial encounter: Secondary | ICD-10-CM

## 2018-05-11 DIAGNOSIS — Z8249 Family history of ischemic heart disease and other diseases of the circulatory system: Secondary | ICD-10-CM | POA: Insufficient documentation

## 2018-05-11 DIAGNOSIS — Z85828 Personal history of other malignant neoplasm of skin: Secondary | ICD-10-CM

## 2018-05-11 DIAGNOSIS — Z87891 Personal history of nicotine dependence: Secondary | ICD-10-CM | POA: Insufficient documentation

## 2018-05-11 DIAGNOSIS — Z79899 Other long term (current) drug therapy: Secondary | ICD-10-CM

## 2018-05-11 DIAGNOSIS — F1010 Alcohol abuse, uncomplicated: Secondary | ICD-10-CM | POA: Insufficient documentation

## 2018-05-11 DIAGNOSIS — I10 Essential (primary) hypertension: Secondary | ICD-10-CM

## 2018-05-11 DIAGNOSIS — I4892 Unspecified atrial flutter: Secondary | ICD-10-CM | POA: Diagnosis not present

## 2018-05-11 DIAGNOSIS — G4730 Sleep apnea, unspecified: Secondary | ICD-10-CM | POA: Insufficient documentation

## 2018-05-11 DIAGNOSIS — R251 Tremor, unspecified: Secondary | ICD-10-CM | POA: Diagnosis not present

## 2018-05-11 DIAGNOSIS — T783XXA Angioneurotic edema, initial encounter: Secondary | ICD-10-CM | POA: Diagnosis present

## 2018-05-11 HISTORY — DX: Angioneurotic edema, initial encounter: T78.3XXA

## 2018-05-11 LAB — CBC WITH DIFFERENTIAL/PLATELET
Abs Immature Granulocytes: 0.02 10*3/uL (ref 0.00–0.07)
Basophils Absolute: 0 10*3/uL (ref 0.0–0.1)
Basophils Relative: 1 %
EOS ABS: 0.4 10*3/uL (ref 0.0–0.5)
Eosinophils Relative: 6 %
HCT: 46 % (ref 39.0–52.0)
Hemoglobin: 15.3 g/dL (ref 13.0–17.0)
Immature Granulocytes: 0 %
Lymphocytes Relative: 26 %
Lymphs Abs: 1.7 10*3/uL (ref 0.7–4.0)
MCH: 30.2 pg (ref 26.0–34.0)
MCHC: 33.3 g/dL (ref 30.0–36.0)
MCV: 90.7 fL (ref 80.0–100.0)
Monocytes Absolute: 1 10*3/uL (ref 0.1–1.0)
Monocytes Relative: 15 %
Neutro Abs: 3.4 10*3/uL (ref 1.7–7.7)
Neutrophils Relative %: 52 %
Platelets: 214 10*3/uL (ref 150–400)
RBC: 5.07 MIL/uL (ref 4.22–5.81)
RDW: 13.5 % (ref 11.5–15.5)
WBC: 6.4 10*3/uL (ref 4.0–10.5)
nRBC: 0 % (ref 0.0–0.2)

## 2018-05-11 LAB — BASIC METABOLIC PANEL
Anion gap: 6 (ref 5–15)
BUN: 11 mg/dL (ref 6–20)
CALCIUM: 8.9 mg/dL (ref 8.9–10.3)
CO2: 25 mmol/L (ref 22–32)
Chloride: 106 mmol/L (ref 98–111)
Creatinine, Ser: 0.6 mg/dL — ABNORMAL LOW (ref 0.61–1.24)
GFR calc Af Amer: >60 mL/min (ref >60)
GFR calc non Af Amer: >60 mL/min (ref >60)
Glucose, Bld: 87 mg/dL (ref 70–99)
Potassium: 3.7 mmol/L (ref 3.5–5.1)
SODIUM: 137 mmol/L (ref 135–145)

## 2018-05-11 LAB — GLUCOSE, CAPILLARY: Glucose-Capillary: 100 mg/dL — ABNORMAL HIGH (ref 70–99)

## 2018-05-11 LAB — PROTIME-INR
INR: 1.03
Prothrombin Time: 13.4 seconds (ref 11.4–15.2)

## 2018-05-11 MED ORDER — DILTIAZEM HCL ER COATED BEADS 180 MG PO CP24
180.0000 mg | ORAL_CAPSULE | Freq: Every day | ORAL | Status: DC
  Filled 2018-05-11: qty 1

## 2018-05-11 MED ORDER — FAMOTIDINE IN NACL 20-0.9 MG/50ML-% IV SOLN
20.0000 mg | Freq: Once | INTRAVENOUS | Status: AC
  Administered 2018-05-11: 20 mg via INTRAVENOUS

## 2018-05-11 MED ORDER — EPINEPHRINE 0.3 MG/0.3ML IJ SOAJ
0.3000 mg | Freq: Once | INTRAMUSCULAR | Status: AC
  Administered 2018-05-11: 0.3 mg via INTRAMUSCULAR
  Filled 2018-05-11: qty 0.3

## 2018-05-11 MED ORDER — ONDANSETRON HCL 4 MG/2ML IJ SOLN: 4.0000 mg | Freq: Four times a day (QID) | INTRAMUSCULAR | Status: DC | PRN

## 2018-05-11 MED ORDER — HYDRALAZINE HCL 20 MG/ML IJ SOLN: 5.0000 mg | Freq: Four times a day (QID) | INTRAMUSCULAR | Status: DC | PRN

## 2018-05-11 MED ORDER — SODIUM CHLORIDE 0.9% FLUSH
3.0000 mL | Freq: Two times a day (BID) | INTRAVENOUS | Status: DC
  Administered 2018-05-11: 3 mL via INTRAVENOUS

## 2018-05-11 MED ORDER — ACETAMINOPHEN 325 MG PO TABS: 650.0000 mg | ORAL_TABLET | Freq: Four times a day (QID) | ORAL | Status: DC | PRN

## 2018-05-11 MED ORDER — METHYLPREDNISOLONE SODIUM SUCC 125 MG IJ SOLR
125.0000 mg | Freq: Once | INTRAMUSCULAR | Status: AC
  Administered 2018-05-11: 125 mg via INTRAVENOUS

## 2018-05-11 MED ORDER — DIPHENHYDRAMINE HCL 50 MG/ML IJ SOLN
25.0000 mg | Freq: Four times a day (QID) | INTRAMUSCULAR | Status: DC
  Administered 2018-05-11: 25 mg via INTRAVENOUS
  Filled 2018-05-11: qty 1

## 2018-05-11 MED ORDER — FAMOTIDINE IN NACL 20-0.9 MG/50ML-% IV SOLN: 20.0000 mg | Freq: Two times a day (BID) | INTRAVENOUS | Status: DC

## 2018-05-11 MED ORDER — DIPHENHYDRAMINE HCL 50 MG/ML IJ SOLN
50.0000 mg | Freq: Once | INTRAMUSCULAR | Status: AC
  Administered 2018-05-11: 50 mg via INTRAVENOUS

## 2018-05-11 MED ORDER — ACETAMINOPHEN 650 MG RE SUPP: 650.0000 mg | Freq: Four times a day (QID) | RECTAL | Status: DC | PRN

## 2018-05-11 MED ORDER — ONDANSETRON HCL 4 MG PO TABS: 4.0000 mg | ORAL_TABLET | Freq: Four times a day (QID) | ORAL | Status: DC | PRN

## 2018-05-11 MED ORDER — METHYLPREDNISOLONE SODIUM SUCC 125 MG IJ SOLR
INTRAMUSCULAR | Status: AC
  Filled 2018-05-11: qty 2

## 2018-05-11 MED ORDER — DIPHENHYDRAMINE HCL 50 MG/ML IJ SOLN
INTRAMUSCULAR | Status: AC
  Filled 2018-05-11: qty 1

## 2018-05-11 MED ORDER — METHYLPREDNISOLONE SODIUM SUCC 125 MG IJ SOLR
60.0000 mg | Freq: Four times a day (QID) | INTRAMUSCULAR | Status: DC
  Administered 2018-05-11: 60 mg via INTRAVENOUS
  Filled 2018-05-11: qty 2

## 2018-05-11 NOTE — ED Notes (Signed)
Lungs clear, resp even and unlabored;

## 2018-05-11 NOTE — ED Notes (Signed)
Airway supplies placed at bedside by Dr Karma Greaser in case emergent airway is needed;

## 2018-05-11 NOTE — ED Notes (Signed)
Dr Karma Greaser in to reassess pt

## 2018-05-11 NOTE — ED Notes (Signed)
Swelling to bottom lip noted to be improved as well as his speech; tongue swelling has also improved;

## 2018-05-11 NOTE — ED Notes (Addendum)
Dr Forbach in to see pt 

## 2018-05-11 NOTE — Progress Notes (Signed)
CBG 100 

## 2018-05-11 NOTE — H&P (Signed)
[facility] [place] NAME: Geoffrey [name]    MR#:  [id number]  DATE OF BIRTH:  [birth year]  DATE OF ADMISSION:  05/11/2018  PRIMARY CARE PHYSICIAN: Mar Daring, PA-C   REQUESTING/REFERRING PHYSICIAN: Dr. Karma Greaser  CHIEF COMPLAINT:   Chief Complaint  Patient presents with  . Angioedema    HISTORY OF PRESENT ILLNESS:  Geoffrey [name]  is a 53 y.o. male with a known history listed below presented to emergency room for evaluation of tongue and lips swelling.  Patient woke up with sudden onset of severe tongue and lip swelling tonight.  Patient had difficulty swallowing along with some scratchy throat.  Patient denies trouble breathing.  Patient on presentation had severe swelling.  Patient is on lisinopril for blood pressure for last 4 years.  Patient was fine without any symptoms before going to bed.  In emergency room patient received epinephrine, Solu-Medrol, Pepcid and Benadryl.  Patient symptoms started improving.  Lip and tongue swelling started going down.  Patient is able to swallow now.  Denies any chest pain or shortness of breath.  No trouble breathing.  No other complaints.  Hospitalist team requested for admission.  PAST MEDICAL HISTORY:   Past Medical History:  Diagnosis Date  . Allergic rhinitis   . Angio-edema, initial encounter   . Asthma   . Basal cell carcinoma of skin   . COPD (chronic obstructive pulmonary disease) ([place])   . Hay fever   . Hypertension   . Sleep apnea     PAST SURGICAL HISTORY:   Past Surgical History:  Procedure Laterality Date  . APPENDECTOMY    . LUNG SURGERY      SOCIAL HISTORY:   Social History   Tobacco Use  . Smoking status: Former Smoker    Types: Pipe, Cigars, Cigarettes    Last attempt to quit: 09/25/2010    Years since quitting: 7.6  . Smokeless tobacco: Former Systems developer    Types: Chew  Substance Use Topics  . Alcohol use: Yes    Comment: 3-4 Bi-weekly; have been a heavy  drinker in the past.    FAMILY HISTORY:   Family History  Problem Relation Age of Onset  . Aneurysm Mother   . Hypertension Father     DRUG ALLERGIES:   Allergies  Allergen Reactions  . Lisinopril Swelling    angioedema  . Amlodipine Other (See Comments)    Restless leg on 10mg   . Aspirin     REVIEW OF SYSTEMS:   ROS -12 point review of system reviewed positive as per HPI otherwise negative  MEDICATIONS AT HOME:   Prior to Admission medications   Medication Sig Start Date End Date Taking? Authorizing Provider  diltiazem (CARDIZEM CD) 120 MG 24 hr capsule TAKE 1 CAPSULE BY MOUTH EVERY DAY 12/17/17  Yes Burnette, Anderson Malta M, PA-C  lisinopril (PRINIVIL,ZESTRIL) 20 MG tablet TAKE ONE (1) TABLET EACH DAY 04/30/18  Yes Mar Daring, PA-C      VITAL SIGNS:  Blood pressure (!) 153/73, pulse 60, temperature 97.9 F (36.6 C), temperature source Oral, resp. rate 19, height 6\' 1"  (1.854 m), weight (!) 154.2 kg, SpO2 94 %.  PHYSICAL EXAMINATION:  Physical Exam  GENERAL:  53 y.o.-year-old patient lying in the bed with no acute distress.  EYES: Pupils equal, round, reactive to light and accommodation. No scleral icterus. Extraocular muscles intact.  HEENT: Head atraumatic, normocephalic.  Tongue and lip swelling noted.  No evidence of  posterior pharyngeal swelling NECK:  Supple, no jugular venous distention. No thyroid enlargement, no tenderness.  LUNGS: Normal breath sounds bilaterally, no wheezing, rales,rhonchi or crepitation. No use of accessory muscles of respiration.  CARDIOVASCULAR: S1, S2 normal. No murmurs, rubs, or gallops.  ABDOMEN: Soft, nontender, nondistended. Bowel sounds present. No organomegaly or mass.  EXTREMITIES: No pedal edema, cyanosis, or clubbing.  NEUROLOGIC: Cranial nerves II through XII are intact. Muscle strength 5/5 in all extremities. Sensation intact. Gait not checked.  PSYCHIATRIC: The patient is alert and oriented x 3.  SKIN: No obvious  rash, lesion, or ulcer.   LABORATORY PANEL:   CBC Recent Labs  Lab 05/11/18 0431  WBC 6.4  HGB 15.3  HCT 46.0  PLT 214   ------------------------------------------------------------------------------------------------------------------  Chemistries  Recent Labs  Lab 05/11/18 0431  NA 137  K 3.7  CL 106  CO2 25  GLUCOSE 87  BUN 11  CREATININE 0.60*  CALCIUM 8.9   ------------------------------------------------------------------------------------------------------------------  Cardiac Enzymes No results for input(s): TROPONINI in the last 168 hours. ------------------------------------------------------------------------------------------------------------------  RADIOLOGY:  No results found.    IMPRESSION AND PLAN:   1.  Angioedema: Likely related to lisinopril.  Lisinopril discontinued.  Patient started on IV Solu-Medrol, Pepcid and Benadryl.  Patient symptoms are improving.  Continue pulse ox and telemetry monitoring for next 12 hours.  Continue to monitor clinically.  2.  Hypertension: Continue Cardizem.  PRN IV hydralazine.  Monitor blood pressure and adjust medication as indicated.  Lisinopril has been discontinued.   DVT prophylaxis: SCD and ad lib.  Further treatment based on clinical course  Estimated length of stay less than 2 midnights    All the records are reviewed and case discussed with ED provider. Management plans discussed with the patient, family and they are in agreement.  CODE STATUS: Full  TOTAL TIME TAKING CARE OF THIS PATIENT: 30 minutes.    Sedalia Muta M.D on 05/11/2018 at 6:17 AM  Between 7am to 6pm - Pager - [phone]  After 6pm go to [email or web address] - [employer] [facility]  Office  [phone]  CC: Primary care physician; Mar Daring, PA-C

## 2018-05-11 NOTE — ED Notes (Signed)
ED TO INPATIENT HANDOFF REPORT  Name/Age/Gender Geoffrey [name] 53 y.o. male  Code Status    Code Status Orders  (From admission, onward)         Start     Ordered   05/11/18 0614  Full code  Continuous     05/11/18 0613        Code Status History    This patient has a current code status but no historical code status.      Home/SNF/Other Home  Chief Complaint swelling tongue and face  Level of Care/Admitting Diagnosis ED Disposition    ED Disposition Condition [facility]: [place] [100120]  Level of Care: Med-Surg [16]  Diagnosis: Angioedema [016010]  Admitting Physician: Sedalia Muta [[id number]]  Attending Physician: Sedalia Muta [[id number]]  PT Class (Do Not Modify): Observation [104]  PT Acc Code (Do Not Modify): Observation [10022]       Medical History Past Medical History:  Diagnosis Date  . Allergic rhinitis   . Angio-edema, initial encounter   . Asthma   . Basal cell carcinoma of skin   . COPD (chronic obstructive pulmonary disease) ([place])   . Hay fever   . Hypertension   . Sleep apnea     Allergies Allergies  Allergen Reactions  . Lisinopril Swelling    angioedema  . Amlodipine Other (See Comments)    Restless leg on 10mg   . Aspirin     IV Location/Drains/Wounds Patient Lines/Drains/Airways Status   Active Line/Drains/Airways    Name:   Placement date:   Placement time:   Site:   Days:   Peripheral IV 05/11/18 Right Antecubital   05/11/18    0349    Antecubital   less than 1          Labs/Imaging Results for orders placed or performed during the hospital encounter of 05/11/18 (from the past 48 hour(s))  Basic metabolic panel     Status: Abnormal   Collection Time: 05/11/18  4:31 AM  Result Value Ref Range   Sodium 137 135 - 145 mmol/L   Potassium 3.7 3.5 - 5.1 mmol/L   Chloride 106 98 - 111 mmol/L   CO2 25 22 - 32 mmol/L   Glucose, Bld 87 70 - 99 mg/dL   BUN 11 6 - 20 mg/dL    Creatinine, Ser 0.60 (L) 0.61 - 1.24 mg/dL   Calcium 8.9 8.9 - 10.3 mg/dL   GFR calc non Af Amer >60 >60 mL/min   GFR calc Af Amer >60 >60 mL/min   Anion gap 6 5 - 15    Comment: Performed at [facility], [address] [place], [place] [address]  CBC with Differential/Platelet     Status: None   Collection Time: 05/11/18  4:31 AM  Result Value Ref Range   WBC 6.4 4.0 - 10.5 K/uL   RBC 5.07 4.22 - 5.81 MIL/uL   Hemoglobin 15.3 13.0 - 17.0 g/dL   HCT 46.0 39.0 - 52.0 %   MCV 90.7 80.0 - 100.0 fL   MCH 30.2 26.0 - 34.0 pg   MCHC 33.3 30.0 - 36.0 g/dL   RDW 13.5 11.5 - 15.5 %   Platelets 214 150 - 400 K/uL   nRBC 0.0 0.0 - 0.2 %   Neutrophils Relative % 52 %   Neutro Abs 3.4 1.7 - 7.7 K/uL   Lymphocytes Relative 26 %   Lymphs Abs 1.7 0.7 - 4.0 K/uL  Monocytes Relative 15 %   Monocytes Absolute 1.0 0.1 - 1.0 K/uL   Eosinophils Relative 6 %   Eosinophils Absolute 0.4 0.0 - 0.5 K/uL   Basophils Relative 1 %   Basophils Absolute 0.0 0.0 - 0.1 K/uL   Immature Granulocytes 0 %   Abs Immature Granulocytes 0.02 0.00 - 0.07 K/uL    Comment: Performed at [facility], [address] [place], [place] [address]  Protime-INR     Status: None   Collection Time: 05/11/18  4:31 AM  Result Value Ref Range   Prothrombin Time 13.4 11.4 - 15.2 seconds   INR 1.03     Comment: Performed at [facility], [address], [place], [place] [address]   No results found.  Pending Labs Unresulted Labs (From admission, onward)    Start     Ordered   05/12/18 7989  Basic metabolic panel  Tomorrow morning,   STAT     05/11/18 0613   05/12/18 0500  CBC  Tomorrow morning,   STAT     05/11/18 0613   05/11/18 0614  HIV antibody (Routine Testing)  Once,   STAT     05/11/18 0613          Vitals/Pain Today's Vitals   05/11/18 0430 05/11/18 0506 05/11/18 0530 05/11/18 0600  BP: 139/71 (!) 153/64 (!) 142/67 (!) 153/73  Pulse: (!) 59 (!) 59 67 60  Resp: 19 (!) 24  19   Temp:      TempSrc:      SpO2: 95% 95% 90% 94%  Weight:      Height:      PainSc:        Isolation Precautions No active isolations  Medications Medications  diltiazem (CARDIZEM CD) 24 hr capsule 180 mg (has no administration in time range)  acetaminophen (TYLENOL) tablet 650 mg (has no administration in time range)    Or  acetaminophen (TYLENOL) suppository 650 mg (has no administration in time range)  ondansetron (ZOFRAN) tablet 4 mg (has no administration in time range)    Or  ondansetron (ZOFRAN) injection 4 mg (has no administration in time range)  sodium chloride flush (NS) 0.9 % injection 3 mL (has no administration in time range)  methylPREDNISolone sodium succinate (SOLU-MEDROL) 125 mg/2 mL injection 60 mg (has no administration in time range)  diphenhydrAMINE (BENADRYL) injection 25 mg (has no administration in time range)  famotidine (PEPCID) IVPB 20 mg premix (has no administration in time range)  hydrALAZINE (APRESOLINE) injection 5 mg (has no administration in time range)  methylPREDNISolone sodium succinate (SOLU-MEDROL) 125 mg/2 mL injection 125 mg (125 mg Intravenous Given 05/11/18 0352)  diphenhydrAMINE (BENADRYL) injection 50 mg (50 mg Intravenous Given 05/11/18 0353)  famotidine (PEPCID) IVPB 20 mg premix (0 mg Intravenous Stopped 05/11/18 0431)  EPINEPHrine (EPI-PEN) injection 0.3 mg (0.3 mg Intramuscular Given 05/11/18 0427)    Mobility walks

## 2018-05-11 NOTE — ED Triage Notes (Addendum)
Pt says he went to bed feeling fine; woke around 2am with swelling to the bottom right lip and left tongue; some difficulty swallowing; denies trouble breathing; speech noted to be slightly impaired due to swelling; pt takes Lisinopril daily; last weekend he had swelling to his upper lip for 2 days; swelling never increased; did not take any medications; swelling resolved on it's own

## 2018-05-11 NOTE — ED Notes (Signed)
Dr Karma Greaser in to reassess pt; speech improving; some decrease noted in swelling to left side of his tongue

## 2018-05-11 NOTE — ED Provider Notes (Signed)
[facility] Emergency Department Provider Note  ____________________________________________   First MD Initiated Contact with Patient 05/11/18 [phone]     (approximate)  I have reviewed the triage vital signs and the nursing notes.   HISTORY  Chief Complaint Angioedema    HPI Geoffrey [name] is a 53 y.o. male whose medical history is listed below who presents for evaluation of acute onset and severe swelling in his tongue and lips.  He reports that he has been taking lisinopril for at least 4 years and has never had a problem in the past.  He has no history of anaphylaxis to anything except may be ibuprofen.  He was sleeping but was awakened with a sensation of fullness and swelling in his mouth and tongue at about 1:30 AM.  He and his wife watch it over the next couple of hours and his seem to be getting worse and he was having more difficulty speaking.  He is not having a significant amount of difficulty swallowing but he felt he had a work at getting his saliva down.  When he arrived in triage he was brought immediately back to a room and I was notified that the patient was here with what appears to be lisinopril induced angioedema.  The patient is awake and alert and not in distress although he has obvious angioedema of the lips and tongue.  He denies recent fever/chills, chest pain, shortness of breath, odynophagia, pain in his neck or throat, and abdominal pain.  He has had no recent medication changes or dose modifications.  No new medications have been started recently.  His symptoms are severe and seem to be getting worse over the last couple of hours.  Past Medical History:  Diagnosis Date  . Allergic rhinitis   . Angio-edema, initial encounter   . Asthma   . Basal cell carcinoma of skin   . COPD (chronic obstructive pulmonary disease) ([place])   . Hay fever   . Hypertension   . Sleep apnea     Patient Active Problem List   Diagnosis Date Noted  .  Alcohol abuse 10/17/2016  . History of thoracentesis 04/07/2015  . H/O benign neoplasm of bronchus and lung 04/07/2015  . Asthma 02/04/2015  . Hypertension 02/04/2015  . Kidney stones 02/04/2015    Past Surgical History:  Procedure Laterality Date  . APPENDECTOMY    . LUNG SURGERY      Prior to Admission medications   Medication Sig Start Date End Date Taking? Authorizing Provider  diltiazem (CARDIZEM CD) 120 MG 24 hr capsule TAKE 1 CAPSULE BY MOUTH EVERY DAY 12/17/17  Yes Burnette, Anderson Malta M, PA-C  lisinopril (PRINIVIL,ZESTRIL) 20 MG tablet TAKE ONE (1) TABLET EACH DAY 04/30/18  Yes Fenton Malling M, PA-C    Allergies Lisinopril; Amlodipine; and Aspirin  Family History  Problem Relation Age of Onset  . Aneurysm Mother   . Hypertension Father     Social History Social History   Tobacco Use  . Smoking status: Former Smoker    Types: Pipe, Cigars, Cigarettes    Last attempt to quit: 09/25/2010    Years since quitting: 7.6  . Smokeless tobacco: Former Systems developer    Types: Chew  Substance Use Topics  . Alcohol use: Yes    Comment: 3-4 Bi-weekly; have been a heavy drinker in the past.  . Drug use: No    Review of Systems Constitutional: No fever/chills Eyes: No visual changes. ENT: Swelling of the tongue and lips  with some associated dysphasia but no odynophagia. Cardiovascular: Denies chest pain. Respiratory: Denies shortness of breath. Gastrointestinal: No abdominal pain.  No nausea, no vomiting.  No diarrhea.  No constipation. Genitourinary: Negative for dysuria. Musculoskeletal: Negative for neck pain.  Negative for back pain. Integumentary: Negative for rash. Neurological: Negative for headaches, focal weakness or numbness.   ____________________________________________   PHYSICAL EXAM:  VITAL SIGNS: ED Triage Vitals  Enc Vitals Group     BP 05/11/18 0351 (!) 157/90     Pulse Rate 05/11/18 0351 70     Resp 05/11/18 0351 17     Temp 05/11/18 0351 97.9 F  (36.6 C)     Temp Source 05/11/18 0351 Oral     SpO2 05/11/18 0351 94 %     Weight 05/11/18 0412 (!) 154.2 kg (340 lb)     Height 05/11/18 0412 1.854 m (6\' 1" )     Head Circumference --      Peak Flow --      Pain Score 05/11/18 0351 0     Pain Loc --      Pain Edu? --      Excl. in [place]? --     Constitutional: Alert and oriented.  No acute distress currently. Eyes: Conjunctivae are normal.  Head: Atraumatic. Nose: No congestion/rhinnorhea. Mouth/Throat: The patient has moderate swelling of the left side of his tongue that appears to go anteriorly to the back of the tongue.  The right half of the tongue is relatively normal in size and shape.  He does also have some swelling most notable of the lower lip which is actually more noticeable on the right side.  There is no evidence of posterior oropharyngeal swelling. Neck: No stridor.  No meningeal signs.  No brawny induration beneath the mandible, no obvious anterior neck swelling. Cardiovascular: Normal rate, regular rhythm. Good peripheral circulation. Grossly normal heart sounds. Respiratory: Normal respiratory effort.  No retractions. Lungs CTAB. Gastrointestinal: Soft and nontender. No distention.  Musculoskeletal: No lower extremity tenderness nor edema. No gross deformities of extremities. Neurologic: Slightly muffled speech. No gross focal neurologic deficits are appreciated.  Skin:  Skin is warm, dry and intact. No rash noted. Psychiatric: Mood and affect are normal. Speech and behavior are normal.  ____________________________________________   LABS (all labs ordered are listed, but only abnormal results are displayed)  Labs Reviewed  BASIC METABOLIC PANEL - Abnormal; Notable for the following components:      Result Value   Creatinine, Ser 0.60 (*)    All other components within normal limits  CBC WITH DIFFERENTIAL/PLATELET  PROTIME-INR   ____________________________________________  EKG  None - EKG not ordered by  ED physician ____________________________________________  RADIOLOGY   ED MD interpretation: No indication for imaging  Official radiology report(s): No results found.  ____________________________________________   PROCEDURES  Critical Care performed: Yes, see critical care procedure note(s)   Procedure(s) performed:   .Critical Care Performed by: Hinda Kehr, MD Authorized by: Hinda Kehr, MD   Critical care provider statement:    Critical care time (minutes):  30   Critical care time was exclusive of:  Separately billable procedures and treating other patients   Critical care was necessary to treat or prevent imminent or life-threatening deterioration of the following conditions: angioedema / anaphylaxis.   Critical care was time spent personally by me on the following activities:  Development of treatment plan with patient or surrogate, discussions with consultants, evaluation of patient's response to treatment, examination of patient, obtaining  history from patient or surrogate, ordering and performing treatments and interventions, ordering and review of laboratory studies, ordering and review of radiographic studies, pulse oximetry, re-evaluation of patient's condition and review of old charts     ____________________________________________   INITIAL IMPRESSION / ASSESSMENT AND PLAN / ED COURSE  As part of my medical decision making, I reviewed the following data within the [place] History obtained from family, Nursing notes reviewed and incorporated, Labs reviewed , Old chart reviewed, Discussed with admitting physician  and Notes from prior ED visits    Differential diagnosis includes, but is not limited to, lisinopril induced angioedema, hereditary angioedema, anaphylaxis, infectious process such as an odontogenic infection including Ludwig's angina.  The patient is not in distress right now but has obvious swelling primarily of the tongue  but also of the lips.  Initially I was very concerned about the possibility of imminent airway loss and I brought emergency airway equipment to the room including glide scope, bougie, scalpel and cric/trach equipment.  We administered Solu-Medrol 125 mg IV, famotidine 20 mg IV, Benadryl 50 mg IV, and EpiPen 0.3 mg intramuscular.  I explained to the patient my concerns and that I would give him about 15 minutes to see if he is improving, staying the same, or worsening, and at that point I would attempt intubation followed by a surgical airway if necessary.  He agreed with the plan.  After 15 minutes he was feeling a little bit better.  Even though on physical exam he appeared to be about the same, his voice was more clear than before.  He was able to tolerate when I laid him in a supine position which was reassuring.  He is handling his secretions without difficulty.  I then reassessed again every 10 minutes until a period of about an hour had passed in the emergency department.  At that point his tongue swelling seem to be going down and I feel comfortable with the plan for admission for further observation.  He is also feeling better and has clear speech at this time even though he still has swelling of the lips and tongue.  I explained to him that he should never again take lisinopril and that he will be prescribed a different antihypertensive before he goes home from the hospital.  I will page the hospitalist to discuss.  Emergency airway equipment is still available in the room.  Clinical Course as of May 11 534  Sun May 11, 2018  0500 I spoke by phone with Dr. Posey Pronto with the hospitalist service and explained the case.  He agrees with the plan and will admit for further observation.   [CF]    Clinical Course User Index [CF] Hinda Kehr, MD    ____________________________________________  FINAL CLINICAL IMPRESSION(S) / ED DIAGNOSES  Final diagnoses:  Angioedema, initial encounter      MEDICATIONS GIVEN DURING THIS VISIT:  Medications  methylPREDNISolone sodium succinate (SOLU-MEDROL) 125 mg/2 mL injection 125 mg (125 mg Intravenous Given 05/11/18 0352)  diphenhydrAMINE (BENADRYL) injection 50 mg (50 mg Intravenous Given 05/11/18 0353)  famotidine (PEPCID) IVPB 20 mg premix (0 mg Intravenous Stopped 05/11/18 0431)  EPINEPHrine (EPI-PEN) injection 0.3 mg (0.3 mg Intramuscular Given 05/11/18 0427)     ED Discharge Orders    None       Note:  This document was prepared using Dragon voice recognition software and may include unintentional dictation errors.   Hinda Kehr, MD 05/11/18 [phone]

## 2018-05-11 NOTE — Progress Notes (Signed)
Patient is alert and oriented and stated he wants to leave AMA. I told him I had paged MD and she would come shortly. Pt stated he already contacted his PA and they will adjust his medications tomorrow. Pt signed AMA form. PIV removed.   Bethann Punches, RN

## 2018-05-11 NOTE — ED Notes (Signed)
Pt placed on cardiac monitor 

## 2018-05-12 ENCOUNTER — Encounter: Payer: Self-pay | Admitting: Physician Assistant

## 2018-05-12 ENCOUNTER — Telehealth: Payer: Self-pay | Admitting: Physician Assistant

## 2018-05-12 DIAGNOSIS — I10 Essential (primary) hypertension: Secondary | ICD-10-CM

## 2018-05-12 DIAGNOSIS — T464X5A Adverse effect of angiotensin-converting-enzyme inhibitors, initial encounter: Principal | ICD-10-CM

## 2018-05-12 DIAGNOSIS — T783XXA Angioneurotic edema, initial encounter: Secondary | ICD-10-CM

## 2018-05-12 MED ORDER — EPINEPHRINE 0.3 MG/0.3ML IJ SOAJ: 0.3000 mg | INTRAMUSCULAR | 1 refills | Status: DC | PRN

## 2018-05-12 MED ORDER — DILTIAZEM HCL ER COATED BEADS 120 MG PO CP24: 240.0000 mg | ORAL_CAPSULE | Freq: Every day | ORAL | 5 refills | Status: DC

## 2018-05-12 NOTE — Addendum Note (Signed)
Addended by: Mar Daring on: 05/12/2018 10:56 AM   Modules accepted: Orders

## 2018-05-12 NOTE — Telephone Encounter (Signed)
Pt needing to speak with Tawanna Sat only.  He left Mychart messages.  Please call pt back.  Thanks, [employer]

## 2018-05-12 NOTE — Addendum Note (Signed)
Addended by: Mar Daring on: 05/12/2018 01:56 PM   Modules accepted: Orders

## 2018-05-12 NOTE — Telephone Encounter (Signed)
Sent mychart messages

## 2018-05-13 ENCOUNTER — Inpatient Hospital Stay
Admission: EM | Admit: 2018-05-13 | Discharge: 2018-05-15 | DRG: 309 | Disposition: A | Discharge status: Home / Self Care | Payer: Managed Care, Other (non HMO) | Attending: Internal Medicine | Admitting: Internal Medicine

## 2018-05-13 ENCOUNTER — Other Ambulatory Visit: Payer: Self-pay

## 2018-05-13 ENCOUNTER — Encounter: Payer: Self-pay | Admitting: Physician Assistant

## 2018-05-13 DIAGNOSIS — I483 Typical atrial flutter: Secondary | ICD-10-CM | POA: Diagnosis not present

## 2018-05-13 DIAGNOSIS — I4892 Unspecified atrial flutter: Principal | ICD-10-CM | POA: Diagnosis present

## 2018-05-13 DIAGNOSIS — Z6841 Body Mass Index (BMI) 40.0 and over, adult: Secondary | ICD-10-CM

## 2018-05-13 DIAGNOSIS — R251 Tremor, unspecified: Secondary | ICD-10-CM | POA: Diagnosis present

## 2018-05-13 DIAGNOSIS — J449 Chronic obstructive pulmonary disease, unspecified: Secondary | ICD-10-CM | POA: Diagnosis present

## 2018-05-13 DIAGNOSIS — I119 Hypertensive heart disease without heart failure: Secondary | ICD-10-CM | POA: Diagnosis present

## 2018-05-13 DIAGNOSIS — Z79899 Other long term (current) drug therapy: Secondary | ICD-10-CM

## 2018-05-13 DIAGNOSIS — E662 Morbid (severe) obesity with alveolar hypoventilation: Secondary | ICD-10-CM | POA: Diagnosis present

## 2018-05-13 DIAGNOSIS — I4891 Unspecified atrial fibrillation: Secondary | ICD-10-CM | POA: Diagnosis present

## 2018-05-13 DIAGNOSIS — Z8249 Family history of ischemic heart disease and other diseases of the circulatory system: Secondary | ICD-10-CM

## 2018-05-13 DIAGNOSIS — E785 Hyperlipidemia, unspecified: Secondary | ICD-10-CM | POA: Diagnosis present

## 2018-05-13 DIAGNOSIS — I248 Other forms of acute ischemic heart disease: Secondary | ICD-10-CM | POA: Diagnosis not present

## 2018-05-13 DIAGNOSIS — R7989 Other specified abnormal findings of blood chemistry: Secondary | ICD-10-CM

## 2018-05-13 DIAGNOSIS — Z886 Allergy status to analgesic agent status: Secondary | ICD-10-CM | POA: Diagnosis not present

## 2018-05-13 DIAGNOSIS — Z888 Allergy status to other drugs, medicaments and biological substances status: Secondary | ICD-10-CM

## 2018-05-13 DIAGNOSIS — R9431 Abnormal electrocardiogram [ECG] [EKG]: Secondary | ICD-10-CM | POA: Diagnosis not present

## 2018-05-13 DIAGNOSIS — I10 Essential (primary) hypertension: Secondary | ICD-10-CM | POA: Diagnosis not present

## 2018-05-13 DIAGNOSIS — Z87891 Personal history of nicotine dependence: Secondary | ICD-10-CM

## 2018-05-13 DIAGNOSIS — R778 Other specified abnormalities of plasma proteins: Secondary | ICD-10-CM

## 2018-05-13 HISTORY — DX: Benign neoplasm of peripheral nerves and autonomic nervous system, unspecified: D36.10

## 2018-05-13 HISTORY — DX: Other ill-defined heart diseases: I51.89

## 2018-05-13 HISTORY — DX: Morbid (severe) obesity due to excess calories: E66.01

## 2018-05-13 LAB — COMPREHENSIVE METABOLIC PANEL
ALK PHOS: 81 U/L (ref 38–126)
ALT: 43 U/L (ref 0–44)
AST: 41 U/L (ref 15–41)
Albumin: 4.1 g/dL (ref 3.5–5.0)
Anion gap: 4 — ABNORMAL LOW (ref 5–15)
BUN: 13 mg/dL (ref 6–20)
CO2: 31 mmol/L (ref 22–32)
Calcium: 9.2 mg/dL (ref 8.9–10.3)
Chloride: 104 mmol/L (ref 98–111)
Creatinine, Ser: 0.64 mg/dL (ref 0.61–1.24)
GFR calc non Af Amer: >60 mL/min (ref >60)
Glucose, Bld: 98 mg/dL (ref 70–99)
Potassium: 3.8 mmol/L (ref 3.5–5.1)
Sodium: 139 mmol/L (ref 135–145)
Total Bilirubin: 1.1 mg/dL (ref 0.3–1.2)
Total Protein: 7.6 g/dL (ref 6.5–8.1)

## 2018-05-13 LAB — CBC
HEMATOCRIT: 48.1 % (ref 39.0–52.0)
Hemoglobin: 16.2 g/dL (ref 13.0–17.0)
MCH: 30.8 pg (ref 26.0–34.0)
MCHC: 33.7 g/dL (ref 30.0–36.0)
MCV: 91.4 fL (ref 80.0–100.0)
Platelets: 245 10*3/uL (ref 150–400)
RBC: 5.26 MIL/uL (ref 4.22–5.81)
RDW: 14.1 % (ref 11.5–15.5)
WBC: 9.7 10*3/uL (ref 4.0–10.5)
nRBC: 0 % (ref 0.0–0.2)

## 2018-05-13 LAB — PROTIME-INR
INR: 1.09
Prothrombin Time: 14 seconds (ref 11.4–15.2)

## 2018-05-13 LAB — APTT: APTT: 28 s (ref 24–36)

## 2018-05-13 LAB — TROPONIN I
Troponin I: 0.04 ng/mL (ref <0.03)
Troponin I: 0.03 ng/mL (ref <0.03)

## 2018-05-13 LAB — TSH: TSH: 4.413 u[IU]/mL (ref 0.350–4.500)

## 2018-05-13 MED ORDER — SODIUM CHLORIDE 0.9% FLUSH: 3.0000 mL | Freq: Once | INTRAVENOUS | Status: DC

## 2018-05-13 MED ORDER — ENOXAPARIN SODIUM 40 MG/0.4ML ~~LOC~~ SOLN: 40.0000 mg | SUBCUTANEOUS | Status: DC

## 2018-05-13 MED ORDER — ACETAMINOPHEN 325 MG PO TABS
650.0000 mg | ORAL_TABLET | ORAL | Status: DC | PRN
  Administered 2018-05-14: 650 mg via ORAL
  Filled 2018-05-13: qty 2

## 2018-05-13 MED ORDER — ALPRAZOLAM 0.25 MG PO TABS
0.2500 mg | ORAL_TABLET | Freq: Three times a day (TID) | ORAL | Status: DC | PRN
  Administered 2018-05-14: 0.25 mg via ORAL
  Filled 2018-05-13: qty 1

## 2018-05-13 MED ORDER — HEPARIN BOLUS VIA INFUSION
7000.0000 [IU] | Freq: Once | INTRAVENOUS | Status: AC
  Administered 2018-05-13: 7000 [IU] via INTRAVENOUS
  Filled 2018-05-13: qty 7000

## 2018-05-13 MED ORDER — ONDANSETRON HCL 4 MG/2ML IJ SOLN: 4.0000 mg | Freq: Four times a day (QID) | INTRAMUSCULAR | Status: DC | PRN

## 2018-05-13 MED ORDER — LEVALBUTEROL HCL 0.63 MG/3ML IN NEBU: 0.6300 mg | INHALATION_SOLUTION | Freq: Four times a day (QID) | RESPIRATORY_TRACT | Status: DC | PRN

## 2018-05-13 MED ORDER — HEPARIN (PORCINE) 25000 UT/250ML-% IV SOLN
1550.0000 [IU]/h | INTRAVENOUS | Status: DC
  Administered 2018-05-13: 1800 [IU]/h via INTRAVENOUS
  Filled 2018-05-13 (×2): qty 250

## 2018-05-13 MED ORDER — DILTIAZEM HCL-DEXTROSE 100-5 MG/100ML-% IV SOLN (PREMIX): 5.0000 mg/h | INTRAVENOUS | Status: DC

## 2018-05-13 MED ORDER — DILTIAZEM HCL ER COATED BEADS 180 MG PO CP24
300.0000 mg | ORAL_CAPSULE | Freq: Every day | ORAL | Status: DC
  Filled 2018-05-13: qty 1

## 2018-05-13 NOTE — ED Notes (Signed)
Per MD Salary, cardizem is as needed

## 2018-05-13 NOTE — ED Triage Notes (Addendum)
Pt states he was seen here 2 days ago for angioedema from lisinopril,. States he stopped taking them and was told by PCP to take two diltiazem in place of the lisinopril. States he is having tremors, shaking all over, has a HA. Just not feeling well today. Feeling like his heart is fluttering, feeling agitated.

## 2018-05-13 NOTE — Consult Note (Signed)
ANTICOAGULATION CONSULT NOTE - Initial Consult  Pharmacy Consult for heparin drip management Indication: atrial fibrillation  Patient Measurements: Height: 6\' 1"  (185.4 cm) Weight: (!) 340 lb (154.2 kg) IBW/kg (Calculated) : 79.9 Heparin Dosing Weight: 116 kg  Vital Signs: Temp: 98.3 F (36.8 C) (02/18 1555) Temp Source: Oral (02/18 1555) BP: 114/101 (02/18 1630) Pulse Rate: 56 (02/18 1630)  Labs: Recent Labs    05/11/18 0431 05/13/18 1214  HGB 15.3 16.2  HCT 46.0 48.1  PLT 214 245  LABPROT 13.4  --   INR 1.03  --   CREATININE 0.60* 0.64  TROPONINI  --  0.04*    Estimated Creatinine Clearance: 165.5 mL/min (by C-G formula based on SCr of 0.64 mg/dL).   Medical History: Past Medical History:  Diagnosis Date  . Allergic rhinitis   . Angio-edema, initial encounter   . Asthma   . Basal cell carcinoma of skin   . COPD (chronic obstructive pulmonary disease) ([place])   . Hay fever   . Hypertension   . Sleep apnea    Assessment: 53 y.o. male with a history of angioedema, asthma, COPD, hypertension, sleep apnea who presents to the ED with new onset afib. His first troponin is elevated at 0.04. He is on no chronic anticoagulation PTA. Baseline labs have been ordered but not yet resulted.  Goal of Therapy:  Heparin level 0.3-0.7 units/ml Monitor platelets by anticoagulation protocol: Yes   Plan:  Give 7000 units bolus x 1 Start heparin infusion at 1800 units/hr Check anti-Xa level in 6 hours and daily while on heparin Continue to monitor H&H and platelets  Dallie Piles, PharmD 05/13/2018,4:58 PM

## 2018-05-13 NOTE — ED Notes (Signed)
Pt provided with towels and gown to change into for the evening.

## 2018-05-13 NOTE — ED Notes (Signed)
Patients wife brought patient food at this time from cafeteria

## 2018-05-13 NOTE — ED Notes (Signed)
Lab called regarding troponin I level sent 1215. Lab tech states it will be run now.

## 2018-05-13 NOTE — Discharge Summary (Signed)
Patient left AGAINST MEDICAL ADVICE.  Diagnosis during hospitalization: Angioedema, hypertension  Hospital course: Geoffrey [name]  is a 53 y.o. male with a known history listed below presented to emergency room for evaluation of tongue and lips swelling.  Patient woke up with sudden onset of severe tongue and lip swelling tonight.  Patient had difficulty swallowing along with some scratchy throat.  Patient denies trouble breathing.  Patient on presentation had severe swelling.  Patient is on lisinopril for blood pressure for last 4 years.  Patient was fine without any symptoms before going to bed.  In emergency room patient received epinephrine, Solu-Medrol, Pepcid and Benadryl.  Patient symptoms started improving.  Lip and tongue swelling started going down.  Patient is able to swallow now.  Denies any chest pain or shortness of breath.  No trouble breathing.  No other complaints.  Hospitalist team requested for admission. Patient started on IV steroid, IV Benadryl and Pepcid.  During hospital course patient felt better and decided to leave [place].  Patient told nurse that he has contacted his primary care physician and they will adjust his medication.

## 2018-05-13 NOTE — ED Notes (Signed)
Pt denies any headache at this time. Pt denies pain at this time.

## 2018-05-13 NOTE — ED Notes (Signed)
ED Provider at bedside. 

## 2018-05-13 NOTE — ED Notes (Signed)
Refer to triage  Notes. Pt c/o headache that started at 0130 this am, pt has been feeling "shaky all over and not feeling well", denies SOB, pain, visual disturbances, CP, dizziness. Wife at bedside.

## 2018-05-13 NOTE — Progress Notes (Signed)
Family Meeting Note  Advance Directive:yes  Today a meeting took place with the Patient.  Patient is able to participate  The following clinical team members were present during this meeting:MD  The following were discussed:Patient's diagnosis:afib , Patient's progosis: Unable to determine and Goals for treatment: Full Code  Additional follow-up to be provided: prn  Time spent during discussion:20 minutes  Gorden Harms, MD

## 2018-05-13 NOTE — Telephone Encounter (Signed)
Discussed with Adriana. Patient advised to ER for re-evaluation. He verbalized understanding.

## 2018-05-13 NOTE — ED Notes (Signed)
Pt denies CP, SOB, no palpitations at this time. Pt spoke to MD Salary.

## 2018-05-13 NOTE — H&P (Addendum)
[facility] NAME: Geoffrey [name]    MR#:  [id number]  DATE OF BIRTH:  November 01, 1965  DATE OF ADMISSION:  05/13/2018  PRIMARY CARE PHYSICIAN: Mar Daring, PA-C   REQUESTING/REFERRING PHYSICIAN:   CHIEF COMPLAINT:   Chief Complaint  Patient presents with  . Headache  . Tremors    HISTORY OF PRESENT ILLNESS: Geoffrey [name]  is a 53 y.o. male with a known history per below, seen in emergency room for angioedema due to ACE inhibitor 2 days ago, presenting today with acute heart racing that started at 1 AM this morning associated with headache, shakiness, tremors, in emergency room patient was found to have acute atrial flutter-new onset, started on heparin drip, troponin 0.04, TSH was normal, patient valuated in the emergency room, wife at the bedside, patient no apparent distress, resting comfortably in bed, patient is now being admitted for acute new onset a flutter with RVR.  PAST MEDICAL HISTORY:   Past Medical History:  Diagnosis Date  . Allergic rhinitis   . Angio-edema, initial encounter   . Asthma   . Basal cell carcinoma of skin   . COPD (chronic obstructive pulmonary disease) ([place])   . Hay fever   . Hypertension   . Sleep apnea     PAST SURGICAL HISTORY:  Past Surgical History:  Procedure Laterality Date  . APPENDECTOMY    . LUNG SURGERY      SOCIAL HISTORY:  Social History   Tobacco Use  . Smoking status: Former Smoker    Types: Pipe, Cigars, Cigarettes    Last attempt to quit: 09/25/2010    Years since quitting: 7.6  . Smokeless tobacco: Former Systems developer    Types: Chew  Substance Use Topics  . Alcohol use: Yes    Comment: 3-4 Bi-weekly; have been a heavy drinker in the past.    FAMILY HISTORY:  Family History  Problem Relation Age of Onset  . Aneurysm Mother   . Hypertension Father     DRUG ALLERGIES:  Allergies  Allergen Reactions  . Lisinopril Swelling    angioedema  . Amlodipine Other (See  Comments)    Restless leg on 10mg   . Aspirin     REVIEW OF SYSTEMS:   CONSTITUTIONAL: No fever, fatigue or weakness.  EYES: No blurred or double vision.  EARS, NOSE, AND THROAT: No tinnitus or ear pain.  RESPIRATORY: No cough, shortness of breath, wheezing or hemoptysis.  CARDIOVASCULAR: No chest pain, orthopnea, edema. + Palpitations GASTROINTESTINAL: No nausea, vomiting, diarrhea or abdominal pain.  GENITOURINARY: No dysuria, hematuria.  ENDOCRINE: No polyuria, nocturia,  HEMATOLOGY: No anemia, easy bruising or bleeding SKIN: No rash or lesion. MUSCULOSKELETAL: No joint pain or arthritis.   NEUROLOGIC: No tingling, numbness, weakness. + Jitteriness/tremors PSYCHIATRY: No anxiety or depression.   MEDICATIONS AT HOME:  Prior to Admission medications   Medication Sig Start Date End Date Taking? Authorizing Provider  diltiazem (CARDIZEM CD) 120 MG 24 hr capsule Take 2 capsules (240 mg total) by mouth daily. 05/12/18   Mar Daring, PA-C  EPINEPHrine 0.3 mg/0.3 mL IJ SOAJ injection Inject 0.3 mLs (0.3 mg total) into the muscle as needed for anaphylaxis. 05/12/18   Mar Daring, PA-C      PHYSICAL EXAMINATION:   VITAL SIGNS: Blood pressure (!) 114/101, pulse (!) 56, temperature 98.3 F (36.8 C), temperature source Oral, resp. rate 14, height 6\' 1"  (1.854 m), weight (!) 154.2 kg, SpO2 97 %.  GENERAL:  53 y.o.-year-old patient lying in the bed with no acute distress.  Morbidly obese EYES: Pupils equal, round, reactive to light and accommodation. No scleral icterus. Extraocular muscles intact.  HEENT: Head atraumatic, normocephalic. Oropharynx and nasopharynx clear.  NECK:  Supple, no jugular venous distention. No thyroid enlargement, no tenderness.  LUNGS: Normal breath sounds bilaterally, no wheezing, rales,rhonchi or crepitation. No use of accessory muscles of respiration.  CARDIOVASCULAR: Irregular rate and rhythm S1, S2 normal. No murmurs, rubs, or gallops.   ABDOMEN: Soft, nontender, nondistended. Bowel sounds present. No organomegaly or mass.  EXTREMITIES: No pedal edema, cyanosis, or clubbing.  NEUROLOGIC: Cranial nerves II through XII are intact. Muscle strength 5/5 in all extremities. Sensation intact. Gait not checked.  PSYCHIATRIC: The patient is alert and oriented x 3.  SKIN: No obvious rash, lesion, or ulcer.   LABORATORY PANEL:   CBC Recent Labs  Lab 05/11/18 0431 05/13/18 1214  WBC 6.4 9.7  HGB 15.3 16.2  HCT 46.0 48.1  PLT 214 245  MCV 90.7 91.4  MCH 30.2 30.8  MCHC 33.3 33.7  RDW 13.5 14.1  LYMPHSABS 1.7  --   MONOABS 1.0  --   EOSABS 0.4  --   BASOSABS 0.0  --    ------------------------------------------------------------------------------------------------------------------  Chemistries  Recent Labs  Lab 05/11/18 0431 05/13/18 1214  NA 137 139  K 3.7 3.8  CL 106 104  CO2 25 31  GLUCOSE 87 98  BUN 11 13  CREATININE 0.60* 0.64  CALCIUM 8.9 9.2  AST  --  41  ALT  --  43  ALKPHOS  --  81  BILITOT  --  1.1   ------------------------------------------------------------------------------------------------------------------ estimated creatinine clearance is 165.5 mL/min (by C-G formula based on SCr of 0.64 mg/dL). ------------------------------------------------------------------------------------------------------------------ Recent Labs    05/13/18 1214  TSH 4.413     Coagulation profile Recent Labs  Lab 05/11/18 0431  INR 1.03   ------------------------------------------------------------------------------------------------------------------- No results for input(s): DDIMER in the last 72 hours. -------------------------------------------------------------------------------------------------------------------  Cardiac Enzymes Recent Labs  Lab 05/13/18 1214  TROPONINI 0.04*    ------------------------------------------------------------------------------------------------------------------ Invalid input(s): POCBNP  ---------------------------------------------------------------------------------------------------------------  Urinalysis No results found for: COLORURINE, APPEARANCEUR, LABSPEC, PHURINE, GLUCOSEU, HGBUR, BILIRUBINUR, KETONESUR, PROTEINUR, UROBILINOGEN, NITRITE, LEUKOCYTESUR   RADIOLOGY: No results found.  EKG: Orders placed or performed during the hospital encounter of 05/13/18  . ED EKG  . ED EKG  . EKG 12-Lead  . EKG 12-Lead  . EKG 12-Lead  . EKG 12-Lead    IMPRESSION AND PLAN: *Acute new onset atrial flutter with RVR Chads vas 2 score-1 Admit to telemetry bed, cycle cardiac enzymes to rule out ACS, check echocardiogram, cardiology/Dr. End consulted for expert opinion, will continue heparin drip for now-suspect can be weaned to Plavix given allergy to aspirin, increase Cardizem to 300 mg daily, Cardizem drip with weaning as tolerated, supplemental oxygen, and continue close medical monitoring  *Chronic asthma/COPD without exacerbation Breathing treatments PRN  *Chronic obstructive sleep apnea CPAP at bedtime/as needed  *Chronic hypertension Cardizem increased, IV hydralazine PRN, vitals per routine, make changes as per necessary  *Recent angioedema Secondary to ACE inhibitor Resolved  *Chronic extreme morbid obesity Most likely secondary to excess calories Lifestyle modification recommended  All the records are reviewed and case discussed with ED provider. Management plans discussed with the patient, family and they are in agreement.  CODE STATUS:full Code Status History    Date Active Date Inactive Code Status Order ID Comments User Context   05/11/2018 [id number] 05/11/2018 1407 Full  Code [id number]  Sedalia Muta, MD ED       TOTAL TIME TAKING CARE OF THIS PATIENT: 40 minutes.    Avel Peace Salary M.D on 05/13/2018    Between 7am to 6pm - Pager - [phone]  After 6pm go to [email or web address] - password [email or web address] [facility]  Office  [phone]  CC: Primary care physician; Mar Daring, PA-C   Note: This dictation was prepared with Dragon dictation along with smaller phrase technology. Any transcriptional errors that result from this process are unintentional.

## 2018-05-13 NOTE — ED Notes (Signed)
Date and time results received: 05/13/18 4:30 PM  (use smartphrase ".now" to insert current time)  Test: troponin Critical Value: 0.04  Name of Provider Notified: Jimmye Norman, MD  Orders Received? Or Actions Taken?: Actions Taken: MD aware

## 2018-05-13 NOTE — ED Notes (Signed)
This RN spoke with Pharmacist, Barbaraann Rondo in regards to bolus 7000 units of heparin exceeding 5000 units on IV pump. Per pharmacist, Heparin bolus of 7000 units is acceptable to give to pt.

## 2018-05-13 NOTE — ED Provider Notes (Signed)
[facility] Emergency Department Provider Note       Time seen: ----------------------------------------- 4:27 PM on 05/13/2018 -----------------------------------------   I have reviewed the triage vital signs and the nursing notes.  HISTORY   Chief Complaint Headache and Tremors    HPI Geoffrey [name] is a 53 y.o. male with a history of angioedema, asthma, COPD, hypertension, sleep apnea who presents to the ED for tremors with shaking all over and headache.  Patient was seen here 2 days ago for angioedema due to lisinopril.  The lisinopril was stopped he was told to take to diltiazem in place of the lisinopril.  Patient states he is not feeling well today, feeling like his heart is fluttering and feeling agitated.  Past Medical History:  Diagnosis Date  . Allergic rhinitis   . Angio-edema, initial encounter   . Asthma   . Basal cell carcinoma of skin   . COPD (chronic obstructive pulmonary disease) ([place])   . Hay fever   . Hypertension   . Sleep apnea     Patient Active Problem List   Diagnosis Date Noted  . Angioedema 05/11/2018  . Alcohol abuse 10/17/2016  . History of thoracentesis 04/07/2015  . H/O benign neoplasm of bronchus and lung 04/07/2015  . Asthma 02/04/2015  . Hypertension 02/04/2015  . Kidney stones 02/04/2015    Past Surgical History:  Procedure Laterality Date  . APPENDECTOMY    . LUNG SURGERY      Allergies Lisinopril; Amlodipine; and Aspirin  Social History Social History   Tobacco Use  . Smoking status: Former Smoker    Types: Pipe, Cigars, Cigarettes    Last attempt to quit: 09/25/2010    Years since quitting: 7.6  . Smokeless tobacco: Former Systems developer    Types: Chew  Substance Use Topics  . Alcohol use: Yes    Comment: 3-4 Bi-weekly; have been a heavy drinker in the past.  . Drug use: No   Review of Systems Constitutional: Negative for fever. Cardiovascular: Negative for chest pain.  Positive for  palpitations Respiratory: Negative for shortness of breath. Gastrointestinal: Negative for abdominal pain, vomiting and diarrhea. Musculoskeletal: Negative for back pain. Skin: Negative for rash. Neurological: Positive for headache, positive for tremor and weakness  All systems negative/normal/unremarkable except as stated in the HPI  ____________________________________________   PHYSICAL EXAM:  VITAL SIGNS: ED Triage Vitals  Enc Vitals Group     BP 05/13/18 1209 (!) 139/98     Pulse Rate 05/13/18 1209 (!) 56     Resp 05/13/18 1209 18     Temp 05/13/18 1209 98.1 F (36.7 C)     Temp Source 05/13/18 1209 Oral     SpO2 05/13/18 1209 96 %     Weight 05/13/18 1210 (!) 340 lb (154.2 kg)     Height 05/13/18 1210 6\' 1"  (1.854 m)     Head Circumference --      Peak Flow --      Pain Score 05/13/18 1210 2     Pain Loc --      Pain Edu? --      Excl. in [place]? --    Constitutional: Alert and oriented. Well appearing and in no distress. Eyes: Conjunctivae are normal. Normal extraocular movements. ENT      Head: Normocephalic and atraumatic.      Nose: No congestion/rhinnorhea.      Mouth/Throat: Mucous membranes are moist.      Neck: No stridor. Cardiovascular: Irregularly irregular rhythm. No  murmurs, rubs, or gallops. Respiratory: Normal respiratory effort without tachypnea nor retractions. Breath sounds are clear and equal bilaterally. No wheezes/rales/rhonchi. Gastrointestinal: Soft and nontender. Normal bowel sounds Musculoskeletal: Nontender with normal range of motion in extremities. No lower extremity tenderness nor edema. Neurologic:  Normal speech and language. No gross focal neurologic deficits are appreciated.  Mild tremor is noted Skin:  Skin is warm, dry and intact. No rash noted. Psychiatric: Mood and affect are normal. Speech and behavior are normal.  ____________________________________________  EKG: Interpreted by me.  Atrial flutter with a variable AV block,  left axis deviation, LVH with QRS widening and repolarization abnormality, normal QT  ____________________________________________  ED COURSE:  As part of my medical decision making, I reviewed the following data within the [place] History obtained from family if available, nursing notes, old chart and ekg, as well as notes from prior ED visits. Patient presented for tremor, we will assess with labs and imaging as indicated at this time.   Procedures ____________________________________________   LABS (pertinent positives/negatives)  Labs Reviewed  COMPREHENSIVE METABOLIC PANEL - Abnormal; Notable for the following components:      Result Value   Anion gap 4 (*)    All other components within normal limits  TROPONIN I - Abnormal; Notable for the following components:   Troponin I 0.04 (*)    All other components within normal limits  CBC  TSH   CRITICAL CARE Performed by: Laurence Aly   Total critical care time: 30 minutes  Critical care time was exclusive of separately billable procedures and treating other patients.  Critical care was necessary to treat or prevent imminent or life-threatening deterioration.  Critical care was time spent personally by me on the following activities: development of treatment plan with patient and/or surrogate as well as nursing, discussions with consultants, evaluation of patient's response to treatment, examination of patient, obtaining history from patient or surrogate, ordering and performing treatments and interventions, ordering and review of laboratory studies, ordering and review of radiographic studies, pulse oximetry and re-evaluation of patient's condition.  ____________________________________________   DIFFERENTIAL DIAGNOSIS   Dehydration, electrolyte abnormality, arrhythmia, alcohol withdrawal, hyperthyroidism  FINAL ASSESSMENT AND PLAN  Atrial flutter, elevated troponin   Plan: The patient had  presented for tremors with palpitations and headache. Patient's labs were unremarkable with the exception of elevated troponin.  Patient was found to be in new onset atrial flutter with an elevated troponin.  Have placed him on heparin.  Currently he is on Cardizem and he is rate controlled.  He will likely need echocardiogram and cardiology consultation.   Laurence Aly, MD    Note: This note was generated in part or whole with voice recognition software. Voice recognition is usually quite accurate but there are transcription errors that can and very often do occur. I apologize for any typographical errors that were not detected and corrected.     Earleen Newport, MD 05/13/18 [phone]

## 2018-05-14 ENCOUNTER — Inpatient Hospital Stay (HOSPITAL_COMMUNITY)
Admit: 2018-05-14 | Discharge: 2018-05-14 | Disposition: A | Discharge status: Home / Self Care | Payer: Managed Care, Other (non HMO) | Attending: Family Medicine | Admitting: Family Medicine

## 2018-05-14 ENCOUNTER — Encounter: Payer: Self-pay | Admitting: *Deleted

## 2018-05-14 ENCOUNTER — Other Ambulatory Visit: Payer: Self-pay

## 2018-05-14 DIAGNOSIS — I248 Other forms of acute ischemic heart disease: Secondary | ICD-10-CM

## 2018-05-14 DIAGNOSIS — I483 Typical atrial flutter: Secondary | ICD-10-CM

## 2018-05-14 DIAGNOSIS — R9431 Abnormal electrocardiogram [ECG] [EKG]: Secondary | ICD-10-CM

## 2018-05-14 LAB — CBC
HEMATOCRIT: 48.3 % (ref 39.0–52.0)
Hemoglobin: 16 g/dL (ref 13.0–17.0)
MCH: 30.4 pg (ref 26.0–34.0)
MCHC: 33.1 g/dL (ref 30.0–36.0)
MCV: 91.8 fL (ref 80.0–100.0)
Platelets: 238 10*3/uL (ref 150–400)
RBC: 5.26 MIL/uL (ref 4.22–5.81)
RDW: 14 % (ref 11.5–15.5)
WBC: 9.5 10*3/uL (ref 4.0–10.5)
nRBC: 0 % (ref 0.0–0.2)

## 2018-05-14 LAB — TROPONIN I
Troponin I: 0.03 ng/mL (ref <0.03)
Troponin I: 0.04 ng/mL (ref <0.03)

## 2018-05-14 LAB — ECHOCARDIOGRAM COMPLETE
Height: 74 in
Weight: 5299.2 oz

## 2018-05-14 LAB — HEPARIN LEVEL (UNFRACTIONATED): Heparin Unfractionated: 0.88 IU/mL — ABNORMAL HIGH (ref 0.30–0.70)

## 2018-05-14 MED ORDER — SODIUM CHLORIDE 0.9% FLUSH
3.0000 mL | Freq: Two times a day (BID) | INTRAVENOUS | Status: DC
  Administered 2018-05-14 – 2018-05-15 (×3): 3 mL via INTRAVENOUS

## 2018-05-14 MED ORDER — SODIUM CHLORIDE 0.9% FLUSH
3.0000 mL | INTRAVENOUS | Status: DC | PRN
  Administered 2018-05-14: 3 mL via INTRAVENOUS
  Filled 2018-05-14: qty 3

## 2018-05-14 MED ORDER — DILTIAZEM HCL ER COATED BEADS 240 MG PO CP24
240.0000 mg | ORAL_CAPSULE | Freq: Every day | ORAL | Status: DC
  Filled 2018-05-14: qty 2
  Filled 2018-05-14: qty 1

## 2018-05-14 MED ORDER — DILTIAZEM HCL ER COATED BEADS 180 MG PO CP24: 180.0000 mg | ORAL_CAPSULE | Freq: Every day | ORAL | Status: DC

## 2018-05-14 MED ORDER — MAGNESIUM SULFATE 2 GM/50ML IV SOLN
2.0000 g | Freq: Once | INTRAVENOUS | Status: AC
  Administered 2018-05-14: 2 g via INTRAVENOUS
  Filled 2018-05-14: qty 50

## 2018-05-14 MED ORDER — DILTIAZEM HCL ER COATED BEADS 180 MG PO CP24
180.0000 mg | ORAL_CAPSULE | Freq: Every day | ORAL | Status: DC
  Administered 2018-05-14: 180 mg via ORAL
  Filled 2018-05-14: qty 1

## 2018-05-14 MED ORDER — APIXABAN 5 MG PO TABS
5.0000 mg | ORAL_TABLET | Freq: Two times a day (BID) | ORAL | Status: DC
  Administered 2018-05-14 – 2018-05-15 (×3): 5 mg via ORAL
  Filled 2018-05-14 (×3): qty 1

## 2018-05-14 MED ORDER — SODIUM CHLORIDE 0.9 % IV SOLN: 250.0000 mL | INTRAVENOUS | Status: DC

## 2018-05-14 NOTE — Consult Note (Signed)
ANTICOAGULATION CONSULT NOTE - Initial Consult  Pharmacy Consult for heparin drip management Indication: atrial fibrillation  Patient Measurements: Height: 6\' 2"  (188 cm) Weight: (!) 331 lb 3.2 oz (150.2 kg) IBW/kg (Calculated) : 82.2 Heparin Dosing Weight: 116 kg  Vital Signs: Temp: 98.1 F (36.7 C) (02/19 0238) Temp Source: Oral (02/19 0238) BP: 140/97 (02/19 0238) Pulse Rate: 80 (02/19 0238)  Labs: Recent Labs    05/11/18 0431 05/13/18 1214 05/13/18 1714 05/13/18 2046 05/14/18 0020 05/14/18 0214  HGB 15.3 16.2  --   --   --   --   HCT 46.0 48.1  --   --   --   --   PLT 214 245  --   --   --   --   APTT  --   --  28  --   --   --   LABPROT 13.4  --  14.0  --   --   --   INR 1.03  --  1.09  --   --   --   HEPARINUNFRC  --   --   --   --   --  0.88*  CREATININE 0.60* 0.64  --   --   --   --   TROPONINI  --  0.04*  --  0.03* 0.03*  --     Estimated Creatinine Clearance: 165.2 mL/min (by C-G formula based on SCr of 0.64 mg/dL).   Medical History: Past Medical History:  Diagnosis Date  . Allergic rhinitis   . Angio-edema, initial encounter   . Asthma   . Basal cell carcinoma of skin   . COPD (chronic obstructive pulmonary disease) ([place])   . Hay fever   . Hypertension   . Sleep apnea    Assessment: 53 y.o. male with a history of angioedema, asthma, COPD, hypertension, sleep apnea who presents to the ED with new onset afib. His first troponin is elevated at 0.04. He is on no chronic anticoagulation PTA. Baseline labs have been ordered but not yet resulted.  Goal of Therapy:  Heparin level 0.3-0.7 units/ml Monitor platelets by anticoagulation protocol: Yes   Plan:  Give 7000 units bolus x 1 Start heparin infusion at 1800 units/hr Check anti-Xa level in 6 hours and daily while on heparin Continue to monitor H&H and platelets   2/19 0200 heparin level 0.88. Decrease to 1550 units/hr and recheck in 6 hours.  Braelee Herrle S, PharmD 05/14/2018,3:15 AM

## 2018-05-14 NOTE — Discharge Instructions (Signed)
Atrial Flutter  Atrial flutter is a type of abnormal heart rhythm (arrhythmia). The heart has an electrical system that tells the heart how to beat. In atrial flutter, the signals move rapidly in the top chambers of the heart (the atria). This makes your heart beat very fast. Atrial flutter can come and go, or it can be permanent. If this condition is not treated it can cause serious complications, such as stroke or weakened heart muscle (cardiomyopathy). What are the causes? This condition may be caused by:  A heart condition or problem, such as: ? A heart attack. ? Heart failure. ? A heart valve problem. ? Heart surgery.  A lung problem, such as: ? A blood clot in the lungs (pulmonary embolism, or PE). ? Chronic obstructive pulmonary disease.  Poorly controlled high blood pressure (hypertension).  Overactive thyroid (hyperthyroidism).  Caffeine.  Some decongestant cold medicines.  Low levels of minerals called electrolytes in the blood.  Cocaine. What increases the risk? You are more likely to develop this condition if:  You are an elderly adult.  You are a man.  You are obese.  You have obstructive sleep apnea.  You have a family history of atrial flutter.  You have diabetes. What are the signs or symptoms? Symptoms of this condition include:  A feeling that your heart is pounding or racing (palpitations).  Shortness of breath.  Chest pain.  Feeling light-headed.  Dizziness.  Fainting.  Low blood pressure (hypotension).  Fatigue. Sometimes there are no symptoms associated with arrhythmia. How is this diagnosed? This condition may be diagnosed based on:  An electrocardiogram (ECG). This is a test that records the electrical signals in the heart.  Ambulatory cardiac monitoring. This is a small recording device that is connected by wires to flat, sticky disks (electrodes) that are attached to your chest.  An echocardiogram. This is a test that uses  sound waves to create pictures of your heart.  A transesophageal echocardiogram (TEE). In this test, a device is placed down your esophagus. This device then uses sounds waves to create even closer pictures of your heart.  Stress test. This test records your heartbeat while you exercise and checks to see if the heart muscle is receiving adequate blood supply. How is this treated? This condition may be treated with:  Medicines to: ? Make your heart beat more slowly. ? Keep your heart in normal rhythm. ? Prevent a stroke.  Cardioversion. This uses medicines or an electrical shock to make the heart beat normally.  Ablation. This destroys the heart tissue that is causing the problem. In some cases, your health care provider will treat other underlying conditions. Follow these instructions at home: Medicines  Take over-the-counter and prescription medicines only as told by your health care provider. ? Make sure you take your medicines exactly as told by your health care provider. ? Do not miss any doses.  Do not take any new medicines without talking to your health care provider. Lifestyle  Eat heart-healthy foods. Talk with a dietitian to make an eating plan that is right for you.  Do not use any products that contain nicotine or tobacco, such as cigarettes and e-cigarettes. If you need help quitting, ask your health care provider.  Limit alcohol intake to no more than 1 drink per day for nonpregnant women and 2 drinks per day for men. One drink equals 12 oz of beer, 5 oz of wine, or 1 oz of hard liquor.  Try to reduce any  contain nicotine or tobacco, such as cigarettes and e-cigarettes. If you need help quitting, ask your health care provider.   Limit alcohol intake to no more than 1 drink per day for nonpregnant women and 2 drinks per day for men. One drink equals 12 oz of beer, 5 oz of wine, or 1 oz of hard liquor.   Try to reduce any stress. Stress can make your symptoms worse.   Get screened for sleep apnea. If you have the condition, work with your health care provider to find a treatment that works for you.   Do not use drugs.   Avoid excessive caffeine.  General instructions   Lose weight if your health care provider tells you to do that.   Keep all follow-up visits as told by your health  care provider. This is important.  Contact a health care provider if:   Your symptoms get worse.   You notice that your palpitations are increasing.  Get help right away if:   You have any symptoms of a stroke. "BE FAST" is an easy way to remember the main warning signs of a stroke:  ? B - Balance. Signs are dizziness, sudden trouble walking, or loss of balance.  ? E - Eyes. Signs are trouble seeing or a sudden change in vision.  ? F - Face. Signs are sudden weakness or numbness of the face, or the face or eyelid drooping on one side.  ? A - Arms. Signs are weakness or numbness in an arm. This happens suddenly and usually on one side of the body.  ? S - Speech. Signs are sudden trouble speaking, slurred speech, or trouble understanding what people say.  ? T - Time. Time to call emergency services. Write down what time symptoms started.   You have other signs of a stroke, such as:  ? A sudden, severe headache with no known cause.  ? Nausea or vomiting.  ? Seizure.   You have additional symptoms, such as:  ? Fainting.  ? Shortness of breath.  ? Pain or pressure in your chest.  ? Suddenly feeling nauseous or suddenly vomiting.  ? Increased sweating with no known cause.   These symptoms may represent a serious problem that is an emergency. Do not wait to see if the symptoms will go away. Get medical help right away. Call your local emergency services (911 in the [place] Do not drive yourself to the hospital.  Summary   Atrial flutter is an abnormal heart rhythm that can give you symptoms of palpitations, shortness of breath, or fatigue.   Atrial flutter is often treated with medicines to keep your heart in a normal rhythm and to prevent a stroke.   You should seek immediate help if you cannot catch your breath, have chest pain or pressure, or have weakness, especially on one side of your body.  This information is not intended to replace advice given to you by your health care provider. Make sure you discuss any  questions you have with your health care provider.  Document Released: 07/29/2008 Document Revised: 11/29/2017 Document Reviewed: 12/13/2016  Elsevier Interactive Patient Education  2019 [employer]

## 2018-05-14 NOTE — Progress Notes (Signed)
*  PRELIMINARY RESULTS* Echocardiogram 2D Echocardiogram has been performed.  Sherrie Sport 05/14/2018, 9:12 AM

## 2018-05-14 NOTE — Consult Note (Signed)
Cardiology Consult    Patient ID: Geoffrey [name] MRN: [id number], DOB/AGE: Mar 14, 1966   Admit date: 05/13/2018 Date of Consult: 05/14/2018  Primary Physician: Mar Daring, PA-C Primary Cardiologist: Kathlyn Sacramento, MD Requesting Provider: R. Wieting, MD  Patient Profile    Geoffrey [name] is a 53 y.o. male with a history of HTN, OSA, palpitations, obesity, left chest schwannoma s/p resection (2015), and COPD, who is being seen today for the evaluation of atrial flutter at the request of Dr. Leslye Peer.  Past Medical History   Past Medical History:  Diagnosis Date  . Allergic rhinitis   . Angio-edema, initial encounter    a. 04/2018 angioedema in setting of lisinopril rx (had been on for ~ 10 yrs prior but recently refilled before event).  . Asthma   . Basal cell carcinoma of skin   . COPD (chronic obstructive pulmonary disease) ([place])   . Diastolic dysfunction    a. 10/2016 Echo: EF 55-65%. Gr2 DD. Mod LVH. Mildly dil LA/RA. Nl RV fxn.  Marland Kitchen Hay fever   . Hypertension   . Morbid obesity ([place])   . Schwannoma (Left chest wall)    a. 2015 s/p VATS/resection ([facility]).  . Sleep apnea     Past Surgical History:  Procedure Laterality Date  . APPENDECTOMY    . LUNG SURGERY       Allergies  Allergies  Allergen Reactions  . Ibuprofen Anaphylaxis  . Lisinopril Swelling    angioedema  . Amlodipine Other (See Comments)    Restless leg on 10mg   . Aspirin     History of Present Illness    53 year old male with the above past medical history including hypertension, sleep apnea on CPAP, palpitations, obesity, left chest schwannoma, and COPD.  He reports a prior history of intermittent chest pain, for which he was worked up by primary care and subsequently cardiology in [place] in 2015.  Echocardiogram at that time showed normal LV function with severe LVH.  Renal arterial duplex was normal.  A chest x-ray was performed and showed a left chest mass.  PET scan was negative.  He was  subsequently seen by thoracic surgery at [facility] and underwent VATS with resection of left chest schwannoma.  Postoperatively, he did develop pleural effusion and required thoracentesis in March 2016.    He was previous evaluated by Dr. Fletcher Anon in cardiology clinic in the summer 2018 for palpitations, dizziness, and bradycardia.  Dizziness was felt to be secondary to bradycardia in the setting of verapamil therapy and the dose was halved.  Echocardiogram was undertaken and showed normal LV function with moderate LVH and grade 2 diastolic dysfunction.  Over this past weekend, he awoke at approximately 1:30 in the morning on February 16 secondary to significant swelling of his tongue associated with jaw pain.  He presented to the emergency department where he was diagnosed with angioedema.  He was treated with IV Solu-Medrol, Pepcid, Benadryl, and intramuscular EpiPen.  Within a few hours, he was feeling much better.  There was an initial plan for admission and observation however, patient left AMA.  Interestingly, he had been on lisinopril for approximately 10 years but says he did recently refill it prior to this event occurring.  Following leaving the hospital, he initially did well but then awoke early on the morning of February 17 with recurrent neck and jaw pain and also noticed his heart was racing.  He went to work but did not feel well and had little energy, along with  a headache and a sensation of a weight on his chest.  After work, he told his wife what was going on and she advised to present to the emergency department.  He ended up presenting to the ED on the afternoon of February 18 where he was noted to be in rate controlled atrial flutter.  Labs were notable for minimal troponin elevation of 0.04-0.03.  He was admitted and placed on heparin therapy.  He has remained in rate controlled atrial flutter and continues to report generalized malaise.  Inpatient Medications    . apixaban  5 mg Oral BID  .  diltiazem  180 mg Oral Daily  . sodium chloride flush  3 mL Intravenous Q12H    Family History    Family History  Problem Relation Age of Onset  . Aneurysm Mother   . Hypertension Father    He indicated that his mother is deceased. He indicated that his father is alive.   Social History    Social History   Socioeconomic History  . Marital status: Married    Spouse name: Not on file  . Number of children: Not on file  . Years of education: Not on file  . Highest education level: Not on file  Occupational History  . Not on file  Social Needs  . Financial resource strain: Not on file  . Food insecurity:    Worry: Not on file    Inability: Not on file  . Transportation needs:    Medical: Not on file    Non-medical: Not on file  Tobacco Use  . Smoking status: Former Smoker    Types: Pipe, Cigars, Cigarettes    Last attempt to quit: 09/25/2010    Years since quitting: 7.6  . Smokeless tobacco: Former Systems developer    Types: Chew  Substance and Sexual Activity  . Alcohol use: Yes    Comment: 3-4 Bi-weekly; have been a heavy drinker in the past.  . Drug use: No  . Sexual activity: Not on file  Lifestyle  . Physical activity:    Days per week: Not on file    Minutes per session: Not on file  . Stress: Not on file  Relationships  . Social connections:    Talks on phone: Not on file    Gets together: Not on file    Attends religious service: Not on file    Active member of club or organization: Not on file    Attends meetings of clubs or organizations: Not on file    Relationship status: Not on file  . Intimate partner violence:    Fear of current or ex partner: Not on file    Emotionally abused: Not on file    Physically abused: Not on file    Forced sexual activity: Not on file  Other Topics Concern  . Not on file  Social History Narrative   Lives locally w/ wife.  Does not routinely exercise.     Review of Systems    General:  +++ malaise, No chills, fever, night  sweats or weight changes.  Cardiovascular:  +++ chest pain ('like a weight'), +++ dyspnea on exertion, +++ palpitations, no edema, orthopnea, paroxysmal nocturnal dyspnea. Dermatological: No rash, lesions/masses Respiratory: No cough, +++ dyspnea w/ activity. Urologic: No hematuria, dysuria Abdominal:   No nausea, vomiting, diarrhea, bright red blood per rectum, melena, or hematemesis Neurologic:  No visual changes, wkns, changes in mental status. All other systems reviewed and are otherwise negative except  as noted above.  Physical Exam    Blood pressure (!) 145/93, pulse (!) 54, temperature 98.3 F (36.8 C), temperature source Oral, resp. rate 16, height 6\' 2"  (1.88 m), weight (!) 150.2 kg, SpO2 96 %.  General: Pleasant, NAD Psych: Normal affect. Neuro: Alert and oriented X 3. Moves all extremities spontaneously. HEENT: Normal  Neck: Supple without bruits or JVD. Lungs:  Resp regular and unlabored, diminished breath sounds at the bases. Heart: Irregularly irregular, no s3, s4, or murmurs. Abdomen: Obese, soft, non-tender, non-distended, BS + x 4.  Extremities: No clubbing, cyanosis or edema. DP/PT/Radials 2+ and equal bilaterally.  Labs    Troponin  Recent Labs    05/13/18 1214 05/13/18 2046 05/14/18 0020 05/14/18 0303  TROPONINI 0.04* 0.03* 0.03* 0.04*   Lab Results  Component Value Date   WBC 9.5 05/14/2018   HGB 16.0 05/14/2018   HCT 48.3 05/14/2018   MCV 91.8 05/14/2018   PLT 238 05/14/2018    Recent Labs  Lab 05/13/18 1214  NA 139  K 3.8  CL 104  CO2 31  BUN 13  CREATININE 0.64  CALCIUM 9.2  PROT 7.6  BILITOT 1.1  ALKPHOS 81  ALT 43  AST 41  GLUCOSE 98   Lab Results  Component Value Date   CHOL 200 (H) 10/02/2016   HDL 56 10/02/2016   LDLCALC 133 (H) 10/02/2016   TRIG 57 10/02/2016     Radiology Studies    No results found.  ECG & Cardiac Imaging    Atrial flutter, 56, LAD, LVH - personally reviewed.  Assessment & Plan    1.  Atrial  Flutter: Patient presented to the emergency department on February 18 with a 1 day history of malaise, palpitations, headache, and a sensation of a weight on his chest.  He was found to be in rate controlled atrial flutter, where he has remained.  Rates have been in the 50s to 60s on oral diltiazem therapy.  He had been placed on heparin and we will discontinue this in favor of Eliquis 5 mg twice daily.  CHA2DS2VASc is 1.  Duration of atrial flutter is unknown.  Though he was seen in the emergency department February 16, ECG was not performed/was not indicated.  We have discussed options for management at length today.  We will plan on TEE cardioversion in the morning with at least 4 weeks of oral anticoagulation following cardioversion.  Continue current dose of diltiazem therapy.  2.  Elevated troponin: Mild troponin elevation with a flat trend (0.04-0.03-0.03-0.04).  He does report mild heaviness across his chest in the setting of atrial flutter since Monday, February 17.  ECG without acute ST changes.  He will require an ischemic evaluation and we can likely defer this to the outpatient setting.  3.  Essential HTN: Blood pressure 145/93 currently.  Home dose of diltiazem has been titrated to 180 mg daily.  He may need an additional agent.  Avoid ACE inhibitor in the setting of recent angioedema on lisinopril.  4.  HL: LDL was 133 in July 2018.  He is not on statin therapy.  Can consider initiation as outpatient if appropriate.  5.  Obstructive sleep apnea: Compliant with CPAP.  Signed, Murray Hodgkins, NP 05/14/2018, 12:59 PM  For questions or updates, please contact   Please consult [email or web address] for contact info under Cardiology/STEMI.

## 2018-05-14 NOTE — Progress Notes (Signed)
Patient ID: Geoffrey [name], male   DOB: [birth year], 53 y.o.   MRN: [id number]  [employer] PROGRESS NOTE  Ryden Wainer QPY:[id number] DOB: 1965/04/18 DOA: 05/13/2018 PCP: Mar Daring, PA-C  HPI/Subjective: Patient was recently in the hospital for angioedema with tongue swelling and lip swelling.  His lisinopril was discontinued.  He signed out [place].  Came in with a shaky feeling and not feeling well and was found to be in atrial fibrillation.  Objective: Vitals:   05/14/18 0632 05/14/18 0816  BP: 123/73 (!) 145/93  Pulse: (!) 53 (!) 54  Resp: 17 16  Temp:  98.3 F (36.8 C)  SpO2:  96%    Filed Weights   05/13/18 1210 05/14/18 0234  Weight: (!) 154.2 kg (!) 150.2 kg    ROS: Review of Systems  Constitutional: Negative for chills and fever.  Eyes: Negative for blurred vision.  Respiratory: Negative for cough and shortness of breath.   Cardiovascular: Positive for palpitations. Negative for chest pain.  Gastrointestinal: Negative for abdominal pain, constipation, diarrhea, nausea and vomiting.  Genitourinary: Negative for dysuria.  Musculoskeletal: Negative for joint pain.  Neurological: Negative for dizziness and headaches.   Exam: Physical Exam  Constitutional: He is oriented to person, place, and time.  HENT:  Nose: No mucosal edema.  Mouth/Throat: No oropharyngeal exudate or posterior oropharyngeal edema.  Eyes: Pupils are equal, round, and reactive to light. Conjunctivae, EOM and lids are normal.  Neck: No JVD present. Carotid bruit is not present. No edema present. No thyroid mass and no thyromegaly present.  Cardiovascular: S1 normal and S2 normal. An irregularly irregular rhythm present. Bradycardia present. Exam reveals no gallop.  No murmur heard. Pulses:      Dorsalis pedis pulses are 2+ on the right side and 2+ on the left side.  Respiratory: No respiratory distress. He has no wheezes. He has no rhonchi. He has no rales.  GI:  Soft. Bowel sounds are normal. There is no abdominal tenderness.  Musculoskeletal:     Right ankle: He exhibits swelling.     Left ankle: He exhibits swelling.  Lymphadenopathy:    He has no cervical adenopathy.  Neurological: He is alert and oriented to person, place, and time. No cranial nerve deficit.  Skin: Skin is warm. No rash noted. Nails show no clubbing.  Psychiatric: He has a normal mood and affect.      Data Reviewed: Basic Metabolic Panel: Recent Labs  Lab 05/11/18 0431 05/13/18 1214  NA 137 139  K 3.7 3.8  CL 106 104  CO2 25 31  GLUCOSE 87 98  BUN 11 13  CREATININE 0.60* 0.64  CALCIUM 8.9 9.2   Liver Function Tests: Recent Labs  Lab 05/13/18 1214  AST 41  ALT 43  ALKPHOS 81  BILITOT 1.1  PROT 7.6  ALBUMIN 4.1   CBC: Recent Labs  Lab 05/11/18 0431 05/13/18 1214 05/14/18 0910  WBC 6.4 9.7 9.5  NEUTROABS 3.4  --   --   HGB 15.3 16.2 16.0  HCT 46.0 48.1 48.3  MCV 90.7 91.4 91.8  PLT 214 245 238   Cardiac Enzymes: Recent Labs  Lab 05/13/18 1214 05/13/18 2046 05/14/18 0020 05/14/18 0303  TROPONINI 0.04* 0.03* 0.03* 0.04*    CBG: Recent Labs  Lab 05/11/18 0749  GLUCAP 100*     Scheduled Meds: . apixaban  5 mg Oral BID  . diltiazem  180 mg Oral Daily  . sodium chloride flush  3 mL Intravenous  Q12H   Continuous Infusions: . sodium chloride      Assessment/Plan:  1. Atrial fibrillation and atrial flutter.  Patient on oral Cardizem CD.  Started on Eliquis.  Patient will have a TEE cardioversion tomorrow if he does not convert today.  Try to give IV magnesium. 2. Sleep apnea and morbid obesity with a BMI of 42.52.  Weight loss needed.  Patient wears CPAP at night. 3. Essential hypertension on Cardizem. 4. Recent angioedema secondary to lisinopril.  No tongue swelling or lip swelling currently.  Code Status:     Code Status Orders  (From admission, onward)         Start     Ordered   05/13/18 2016  Full code  Continuous      05/13/18 2015        Code Status History    Date Active Date Inactive Code Status Order ID Comments User Context   05/11/2018 0613 05/11/2018 1407 Full Code [id number]  Sedalia Muta, MD ED     Disposition Plan: Potential disposition tomorrow afternoon after cardioversion tomorrow  Consultants:  Cardiology  Time spent: 28 minutes  [place]

## 2018-05-14 NOTE — Plan of Care (Signed)
  Problem: Education: Goal: Knowledge of General Education information will improve Description Including pain rating scale, medication(s)/side effects and non-pharmacologic comfort measures Outcome: Progressing   Problem: Health Behavior/Discharge Planning: Goal: Ability to manage health-related needs will improve Outcome: Progressing   Problem: Clinical Measurements: Goal: Will remain free from infection Outcome: Progressing Note:  Remains afebrile Goal: Diagnostic test results will improve Outcome: Progressing Note:  Troponins .04,0.03, 0.03, 0.04  Goal: Respiratory complications will improve Outcome: Progressing Goal: Cardiovascular complication will be avoided Outcome: Progressing Note:  Remains in aflutter, no other arrhythmias noted   Problem: Education: Goal: Understanding of medication regimen will improve Outcome: Progressing Note:  Eliquis hand out given to patient Goal: Individualized Educational Video(s) Outcome: Progressing Note:  Cardioversion education given to patient

## 2018-05-14 NOTE — Progress Notes (Addendum)
Pt requesting Cardizem early as he normally takes at 7 AM.  Med scheduled for 10 AM.  Checked vitals and HR 53-72, irregular, BP 123/73.  Sent text message to Dr Margaretmary Eddy, on call hospitalist, for order to give early. Dorna Bloom RN [phone] No response.  Phone paged hospitalist to request permission to give Cardizem early. Dorna Bloom RN

## 2018-05-15 ENCOUNTER — Encounter
Admission: EM | Disposition: A | Discharge status: Home / Self Care | Payer: Self-pay | Source: Home / Self Care | Attending: Internal Medicine

## 2018-05-15 DIAGNOSIS — I10 Essential (primary) hypertension: Secondary | ICD-10-CM

## 2018-05-15 LAB — HIV ANTIBODY (ROUTINE TESTING W REFLEX): HIV Screen 4th Generation wRfx: NONREACTIVE

## 2018-05-15 SURGERY — ECHOCARDIOGRAM, TRANSESOPHAGEAL
Anesthesia: Choice

## 2018-05-15 MED ORDER — APIXABAN 5 MG PO TABS: 5.0000 mg | ORAL_TABLET | Freq: Two times a day (BID) | ORAL | 0 refills | Status: DC

## 2018-05-15 MED ORDER — NIFEDIPINE ER 30 MG PO TB24: 30.0000 mg | ORAL_TABLET | Freq: Every day | ORAL | 0 refills | Status: DC

## 2018-05-15 MED ORDER — NIFEDIPINE ER OSMOTIC RELEASE 30 MG PO TB24
30.0000 mg | ORAL_TABLET | Freq: Every day | ORAL | Status: DC
  Administered 2018-05-15: 30 mg via ORAL
  Filled 2018-05-15: qty 1

## 2018-05-15 NOTE — Progress Notes (Signed)
Patient ID: Geoffrey [name]    Work note.  Patient was admitted to [facility] on 05/13/2018.  Discharged from the hospital to 05/15/2018.  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  Can start doing full duty work on 06/02/2018.  Dr. Loletha Grayer [phone]

## 2018-05-15 NOTE — Progress Notes (Addendum)
RN notified by telemetry technician that patient converted to NSR around 2359. RN confirmed. Patient resting comfortably in bed at this time.   Update: MD End made aware that patient converted to NSR. Specials RN also made aware.

## 2018-05-15 NOTE — Progress Notes (Signed)
Progress Note  Patient Name: Geoffrey [name] Date of Encounter: 05/15/2018  Primary Cardiologist: Kathlyn Sacramento, MD   Subjective   Patient converted from atrial flutter to sinus rhythm around midnight.  He notes some improvement, though he still feels a bit lightheaded and generally unwell.  No chest pain or shortness of breath.  Inpatient Medications    Scheduled Meds: . apixaban  5 mg Oral BID  . NIFEdipine  30 mg Oral Daily  . sodium chloride flush  3 mL Intravenous Q12H   Continuous Infusions: . sodium chloride     PRN Meds: acetaminophen, ALPRAZolam, levalbuterol, ondansetron (ZOFRAN) IV, sodium chloride flush   Vital Signs    Vitals:   05/14/18 1713 05/14/18 1944 05/14/18 2241 05/15/18 0337  BP: (!) 146/88 132/82  (!) 151/84  Pulse: 80 80 69 63  Resp:  17 18 20   Temp: 98 F (36.7 C) 98.1 F (36.7 C)  97.6 F (36.4 C)  TempSrc: Oral Oral  Oral  SpO2: 93% 94% 97% 95%  Weight:      Height:        Intake/Output Summary (Last 24 hours) at 05/15/2018 0725 Last data filed at 05/15/2018 0546 Gross per 24 hour  Intake 503.34 ml  Output 1875 ml  Net -1371.66 ml   Last 3 Weights 05/14/2018 05/13/2018 05/11/2018  Weight (lbs) 331 lb 3.2 oz 340 lb 340 lb  Weight (kg) 150.231 kg 154.223 kg 154.223 kg      Telemetry    Atrial flutter with conversion to sinus rhythm with PACs around midnight - Personally Reviewed  ECG    Sinus bradycardia with left axis deviation and LVH - Personally Reviewed  Physical Exam   GEN: No acute distress.   Neck:  Unable to assess JVP due to body habitus and facial hair. Cardiac:  Bradycardic but regular without murmurs, rubs, or gallops. Respiratory: Clear to auscultation bilaterally. GI: Soft, nontender, non-distended  MS: No edema; No deformity. Neuro:  Nonfocal  Psych: Normal affect   Labs    Chemistry Recent Labs  Lab 05/11/18 0431 05/13/18 1214  NA 137 139  K 3.7 3.8  CL 106 104  CO2 25 31  GLUCOSE 87 98  BUN  11 13  CREATININE 0.60* 0.64  CALCIUM 8.9 9.2  PROT  --  7.6  ALBUMIN  --  4.1  AST  --  41  ALT  --  43  ALKPHOS  --  81  BILITOT  --  1.1  GFRNONAA >60 >60  GFRAA >60 >60  ANIONGAP 6 4*     Hematology Recent Labs  Lab 05/11/18 0431 05/13/18 1214 05/14/18 0910  WBC 6.4 9.7 9.5  RBC 5.07 5.26 5.26  HGB 15.3 16.2 16.0  HCT 46.0 48.1 48.3  MCV 90.7 91.4 91.8  MCH 30.2 30.8 30.4  MCHC 33.3 33.7 33.1  RDW 13.5 14.1 14.0  PLT 214 245 238    Cardiac Enzymes Recent Labs  Lab 05/13/18 1214 05/13/18 2046 05/14/18 0020 05/14/18 0303  TROPONINI 0.04* 0.03* 0.03* 0.04*   No results for input(s): TROPIPOC in the last 168 hours.   BNPNo results for input(s): BNP, PROBNP in the last 168 hours.   DDimer No results for input(s): DDIMER in the last 168 hours.   Radiology    No results found.  Cardiac Studies   TTE (05/14/2018):  1. The left ventricle has normal systolic function with an ejection fraction of 60-65%. The cavity size was normal. There is moderate  asymmetric left ventricular hypertrophy. Left ventricular diastology could not be evaluated due to nondiagnostic  images.  2. Left atrial size was not well visualized.  3. The mitral valve was not well visualized. Mild thickening of the mitral valve leaflet.  4. The tricuspid valve is not well visualized. Tricuspid valve regurgitation was not assessed by color flow Doppler.  5. The aortic valve was not well visualized Aortic valve regurgitation was not assessed by color flow Doppler.  6. The aortic root is normal in size and structure.  7. The interatrial septum was not well visualized.  Patient Profile     53 y.o. male man with history of hypertension, COPD, obesity, and a left chest schwannoma status post resection, admitted with atrial flutter.  Assessment & Plan    Atrial flutter Patient spontaneously converted to sinus rhythm.  He still remains somewhat lightheaded and generally fatigued in the setting of  sinus bradycardia.  We have discussed treatment options for his hypertension and have agreed to a trial of nifedipine, as below.  I will discontinue diltiazem.  We have agreed to a 1 month course of apixaban 5 mg twice daily.  If he maintains sinus rhythm, I would favor discontinuing apixaban at that time given a CHADSVASC score of 1.  I encouraged Mr. Schuenemann to avoid caffeine and alcohol as well as to remain compliant with CPAP.  Hypertension BP upper normal to mildly elevated.  He is currently on diltiazem.  I am leery to escalate this further in the setting of resting bradycardia.  In fact, we have agreed to discontinue this altogether at this time and rechallenge him with a non-dihydropyridine calcium channel blocker.  He notes restless legs in the past with amlodipine.  We will try nifedipine to see if he tolerates this better.  He has been intolerant of HCTZ in the past and also recently had suspected angioedema with lisinopril precluding rechallenge with ACE inhibitor or trial of an ARB.  [facility] will sign off.  Patient is appropriate for discharge home today. Medication Recommendations: Apixaban 5 mg twice daily and nifedipine extended release 30 mg daily Other recommendations (labs, testing, etc): None. Follow up as an outpatient: Dr. Fletcher Anon or APP in 3-4 weeks.  For questions or updates, please contact [place] Please consult [email or web address] for contact info under [facility] Cardiology.     Signed, Nelva Bush, MD  05/15/2018, 7:25 AM

## 2018-05-15 NOTE — Progress Notes (Signed)
Geoffrey [name] to be D/C'd Home per MD order. Patient given discharge teaching and paperwork regarding medications, diet, follow-up appointments and activity. Patient understanding verbalized. No questions or complaints at this time. Skin condition as charted. IV and telemetry removed prior to leaving.  No further needs by Care Management/Social Work. Prescriptions e-prescribed to pharmacy; eliquis coupon given by CM.  An After Visit Summary was printed and given to the patient.   Patient escorted via wheelchair by volunteer.  [name]

## 2018-05-15 NOTE — Discharge Summary (Signed)
[facility] [place] NAME: Geoffrey [name]    MR#:  [id number]  DATE OF BIRTH:  06/21/1965  DATE OF ADMISSION:  05/13/2018 ADMITTING PHYSICIAN: Gorden Harms, MD  DATE OF DISCHARGE: 05/15/2018 10:33 AM  PRIMARY CARE PHYSICIAN: Patient interested in switching primary care physicians.   ADMISSION DIAGNOSIS:  Elevated troponin I level [R79.89] Atrial flutter, unspecified type ([place]) [I48.92]  DISCHARGE DIAGNOSIS:  Active Problems:   A-fib ([place])   SECONDARY DIAGNOSIS:   Past Medical History:  Diagnosis Date  . Allergic rhinitis   . Angio-edema, initial encounter    a. 04/2018 angioedema in setting of lisinopril rx (had been on for ~ 10 yrs prior but recently refilled before event).  . Asthma   . Basal cell carcinoma of skin   . COPD (chronic obstructive pulmonary disease) ([place])   . Diastolic dysfunction    a. 10/2016 Echo: EF 55-65%. Gr2 DD. Mod LVH. Mildly dil LA/RA. Nl RV fxn.  Marland Kitchen Hay fever   . Hypertension   . Morbid obesity ([place])   . Schwannoma (Left chest wall)    a. 2015 s/p VATS/resection ([facility]).  . Sleep apnea     HOSPITAL COURSE:   1.  Atrial fibrillation and atrial flutter.  The patient converted to normal sinus rhythm last night.  Patient did not need a cardioversion.  The patient was started on Eliquis and nifedipine long-acting. 2.  Sleep apnea and morbid obesity.  BMI 42.52.  Weight loss needed.  Continue CPAP at night. 3.  Essential hypertension on nifedipine long-acting. 4.  Recent angioedema secondary to lisinopril.  Currently no tongue swelling or lip swelling currently. 5.  Note for work given.  DISCHARGE CONDITIONS:   Satisfactory  CONSULTS OBTAINED:  Treatment Team:  Nelva Bush, MD  DRUG ALLERGIES:   Allergies  Allergen Reactions  . Ibuprofen Anaphylaxis  . Lisinopril Swelling    angioedema  . Amlodipine Other (See Comments)    Restless leg on 10mg   . Aspirin     DISCHARGE MEDICATIONS:    Allergies as of 05/15/2018      Reactions   Ibuprofen Anaphylaxis   Lisinopril Swelling   angioedema   Amlodipine Other (See Comments)   Restless leg on 10mg    Aspirin       Medication List    STOP taking these medications   diltiazem 120 MG 12 hr capsule Commonly known as:  CARDIZEM SR   EPINEPHrine 0.3 mg/0.3 mL Soaj injection Commonly known as:  EPI-PEN     TAKE these medications   apixaban 5 MG Tabs tablet Commonly known as:  ELIQUIS Take 1 tablet (5 mg total) by mouth 2 (two) times daily.   NIFEdipine 30 MG 24 hr tablet Commonly known as:  ADALAT CC Take 1 tablet (30 mg total) by mouth daily.        DISCHARGE INSTRUCTIONS:   Follow-up cardiology 1 week Patient interested in a new primary care physician.  I gave the patient the name of Dr. Harrel Lemon.  If you experience worsening of your admission symptoms, develop shortness of breath, life threatening emergency, suicidal or homicidal thoughts you must seek medical attention immediately by calling 911 or calling your MD immediately  if symptoms less severe.  You Must read complete instructions/literature along with all the possible adverse reactions/side effects for all the Medicines you take and that have been prescribed to you. Take any new Medicines after you have completely understood and accept all  the possible adverse reactions/side effects.   Please note  You were cared for by a hospitalist during your hospital stay. If you have any questions about your discharge medications or the care you received while you were in the hospital after you are discharged, you can call the unit and asked to speak with the hospitalist on call if the hospitalist that took care of you is not available. Once you are discharged, your primary care physician will handle any further medical issues. Please note that NO REFILLS for any discharge medications will be authorized once you are discharged, as it is imperative that you  return to your primary care physician (or establish a relationship with a primary care physician if you do not have one) for your aftercare needs so that they can reassess your need for medications and monitor your lab values.    Today   CHIEF COMPLAINT:   Chief Complaint  Patient presents with  . Headache  . Tremors    HISTORY OF PRESENT ILLNESS:  Geoffrey [name]  is a 53 y.o. male came in with headache and tremors and found to be in atrial fibrillation atrial flutter.   VITAL SIGNS:  Blood pressure 115/84, pulse (!) 59, temperature (!) 97.5 F (36.4 C), resp. rate 16, height 6\' 2"  (1.88 m), weight (!) 150.2 kg, SpO2 96 %.   PHYSICAL EXAMINATION:  GENERAL:  53 y.o.-year-old patient lying in the bed with no acute distress.  EYES: Pupils equal, round, reactive to light and accommodation. No scleral icterus. Extraocular muscles intact.  HEENT: Head atraumatic, normocephalic. Oropharynx and nasopharynx clear.  NECK:  Supple, no jugular venous distention. No thyroid enlargement, no tenderness.  LUNGS: Normal breath sounds bilaterally, no wheezing, rales,rhonchi or crepitation. No use of accessory muscles of respiration.  CARDIOVASCULAR: S1, S2 normal. No murmurs, rubs, or gallops.  ABDOMEN: Soft, non-tender, non-distended. Bowel sounds present. No organomegaly or mass.  EXTREMITIES: No pedal edema, cyanosis, or clubbing.  NEUROLOGIC: Cranial nerves II through XII are intact. Muscle strength 5/5 in all extremities. Sensation intact. Gait not checked.  PSYCHIATRIC: The patient is alert and oriented x 3.  SKIN: No obvious rash, lesion, or ulcer.   DATA REVIEW:   CBC Recent Labs  Lab 05/14/18 0910  WBC 9.5  HGB 16.0  HCT 48.3  PLT 238    Chemistries  Recent Labs  Lab 05/13/18 1214  NA 139  K 3.8  CL 104  CO2 31  GLUCOSE 98  BUN 13  CREATININE 0.64  CALCIUM 9.2  AST 41  ALT 43  ALKPHOS 81  BILITOT 1.1    Cardiac Enzymes Recent Labs  Lab 05/14/18 0303   TROPONINI 0.04*    Management plans discussed with the patient, family and they are in agreement.  CODE STATUS:  Code Status History    Date Active Date Inactive Code Status Order ID Comments User Context   05/13/2018 2015 05/15/2018 1339 Full Code [id number]  Gorden Harms, MD ED   05/11/2018 0613 05/11/2018 1407 Full Code [id number]  Sedalia Muta, MD ED      TOTAL TIME TAKING CARE OF THIS PATIENT: 35 minutes.    Loletha Grayer M.D on 05/15/2018 at 4:42 PM  Between 7am to 6pm - Pager - [phone]  After 6pm go to [email or web address] - password [email or web address] [facility]  [facility] Office  [phone]  CC: Primary care physician; to be Dr. Harrel Lemon

## 2018-05-15 NOTE — Plan of Care (Signed)
  Problem: Activity: Goal: Ability to tolerate increased activity will improve Outcome: Progressing   Problem: Cardiac: Goal: Ability to achieve and maintain adequate cardiopulmonary perfusion will improve Outcome: Progressing Note:  Patient converted to NSR at 2359.

## 2018-05-15 NOTE — Care Management Note (Signed)
Case Management Note  Patient Details  Name: Geoffrey [name] MRN: [id number] Date of Birth: [birth year]  Subjective/Objective:    Patient is independent from home with significant other.  He is independent in all ADL's.  Starting on Eliquis.  30 day free coupon and $10 co-pay assist coupon provided to patient.  Current with PCP.  Obtains prescription from [facility].  No further needs identified.                  Action/Plan:   Expected Discharge Date:  05/15/18               Expected Discharge Plan:  Home/Self Care  In-House Referral:     Discharge planning Services  CM Consult  Post Acute Care Choice:    Choice offered to:     DME Arranged:    DME Agency:     HH Arranged:    HH Agency:     Status of Service:  Completed, signed off  If discussed at [employer] of Stay Meetings, dates discussed:    Additional Comments:  Elza Rafter, RN 05/15/2018, 10:02 AM

## 2018-05-16 ENCOUNTER — Telehealth: Payer: Self-pay

## 2018-05-16 NOTE — Telephone Encounter (Signed)
-----   Message from Nelva Bush, MD sent at 05/15/2018  8:32 AM EST ----- Good morning,  Mr. Geoffrey [name] is being d/c'ed today.  Can you help set him up for a f/u with Dr. Fletcher Anon or an APP in 2-3 weeks?  Thanks.  Gerald Stabs

## 2018-05-16 NOTE — Telephone Encounter (Signed)
LMOM to move 319 sooner (TOC) per Dr. Harrell Gave End

## 2018-05-21 ENCOUNTER — Encounter: Payer: Self-pay | Admitting: Physician Assistant

## 2018-05-21 ENCOUNTER — Telehealth: Payer: Self-pay | Admitting: Physician Assistant

## 2018-05-21 NOTE — Telephone Encounter (Signed)
No answer. Left detailed message, ok per DPR, that [place] response has been sent and to please call us back if any urgent medical concerns.

## 2018-05-21 NOTE — Telephone Encounter (Signed)
TCM....  Patient is being discharged   They saw Dr. Arlean Hopping End  They are scheduled to see Christell Faith, PA on  3-6  They were seen for   They need to be seen within 2-3 weeks    Please call

## 2018-05-21 NOTE — Telephone Encounter (Signed)
Patient scheduled for 2/27 at 10 am

## 2018-05-21 NOTE — Telephone Encounter (Addendum)
Patient contacted regarding discharge from [facility] on 05/15/18.  Patient understands to follow up with provider Sharolyn Douglas on 05/22/18 at [place] [facility] [place]. Patient understands discharge instructions? yes Patient understands medications and regiment? yes Patient understands to bring all medications to this visit? yes  Patient expressed he is having occasional symptoms and is unsure of what he can and cannot do.  His symptoms are the feeling of his heart pounding and sweating and the shakes. He denies chest pain. He would prefer to be seen tomorrow to discuss these and I recommended that would be a good idea. He is scheduled for tomorrow.

## 2018-05-21 NOTE — Telephone Encounter (Signed)
Spoke with patient on the phone. See phone note. Patient coming in tomorrow for appointment.

## 2018-05-22 ENCOUNTER — Ambulatory Visit
Admission: RE | Admit: 2018-05-22 | Discharge: 2018-05-22 | Disposition: A | Discharge status: Home / Self Care | Payer: Managed Care, Other (non HMO) | Source: Ambulatory Visit | Attending: Nurse Practitioner | Admitting: Nurse Practitioner

## 2018-05-22 ENCOUNTER — Encounter: Payer: Self-pay | Admitting: Nurse Practitioner

## 2018-05-22 ENCOUNTER — Ambulatory Visit: Payer: Managed Care, Other (non HMO) | Admitting: Nurse Practitioner

## 2018-05-22 ENCOUNTER — Ambulatory Visit (INDEPENDENT_AMBULATORY_CARE_PROVIDER_SITE_OTHER): Payer: Managed Care, Other (non HMO) | Admitting: Nurse Practitioner

## 2018-05-22 ENCOUNTER — Ambulatory Visit (INDEPENDENT_AMBULATORY_CARE_PROVIDER_SITE_OTHER): Payer: Managed Care, Other (non HMO)

## 2018-05-22 VITALS — BP 160/70 | HR 66 | Ht 74.0 in | Wt 336.5 lb

## 2018-05-22 DIAGNOSIS — R778 Other specified abnormalities of plasma proteins: Secondary | ICD-10-CM

## 2018-05-22 DIAGNOSIS — R042 Hemoptysis: Secondary | ICD-10-CM

## 2018-05-22 DIAGNOSIS — G4733 Obstructive sleep apnea (adult) (pediatric): Secondary | ICD-10-CM | POA: Diagnosis not present

## 2018-05-22 DIAGNOSIS — I4892 Unspecified atrial flutter: Secondary | ICD-10-CM | POA: Diagnosis not present

## 2018-05-22 DIAGNOSIS — R7989 Other specified abnormal findings of blood chemistry: Secondary | ICD-10-CM

## 2018-05-22 DIAGNOSIS — I4891 Unspecified atrial fibrillation: Secondary | ICD-10-CM

## 2018-05-22 DIAGNOSIS — K921 Melena: Secondary | ICD-10-CM

## 2018-05-22 DIAGNOSIS — I10 Essential (primary) hypertension: Secondary | ICD-10-CM | POA: Diagnosis not present

## 2018-05-22 MED ORDER — DILTIAZEM HCL ER COATED BEADS 120 MG PO CP24: 120.0000 mg | ORAL_CAPSULE | Freq: Every day | ORAL | 2 refills | Status: DC

## 2018-05-22 NOTE — Patient Instructions (Addendum)
Medication Instructions:  Your physician has recommended you make the following change in your medication:  1- STOP Eliquis. 2- STOP Niphedipine. 3- START Diltiazem CD 120 mg (1 tablet) by mouth once a day.  If you need a refill on your cardiac medications before your next appointment, please call your pharmacy.   Lab work: Your physician recommends that you return for lab work in: TODAY - CBC.  If you have labs (blood work) drawn today and your tests are completely normal, you will receive your results only by: Marland Kitchen MyChart Message (if you have MyChart) OR . A paper copy in the mail If you have any lab test that is abnormal or we need to change your treatment, we will call you to review the results.  Testing/Procedures:  Today at the [facility]- A chest x-ray takes a picture of the organs and structures inside the chest, including the heart, lungs, and blood vessels. This test can show several things, including, whether the heart is enlarges; whether fluid is building up in the lungs; and whether pacemaker / defibrillator leads are still in place. Please go to the [facility]. You will check in at the front desk to the right as you walk into the atrium. Valet Parking is offered if needed.   Your physician has recommended that you wear an 14 DAY ZIO event monitor. Event monitors are medical devices that record the heart's electrical activity. Doctors most often [place] these monitors to diagnose arrhythmias. Arrhythmias are problems with the speed or rhythm of the heartbeat. The monitor is a small, portable device. You can wear one while you do your normal daily activities. This is usually used to diagnose what is causing palpitations/syncope (passing out).  A Zio Patch Event Heart monitor will be applied to your chest today.  You will wear the patch for 14 days. After 24 hours, you may shower with the heart monitor on. If you feel any SYMPTOMS, you may press and release the button in the  middle of the monitor.   Follow-Up: At [facility], you and your health needs are our priority.  As part of our continuing mission to provide you with exceptional heart care, we have created designated Provider Care Teams.  These Care Teams include your primary Cardiologist (physician) and Advanced Practice Providers (APPs -  Physician Assistants and Nurse Practitioners) who all work together to provide you with the care you need, when you need it. You will need a follow up appointment in 3 weeks.  Please call our office 2 months in advance to schedule this appointment.  You may see Kathlyn Sacramento, MD or Ignacia Bayley, NP.

## 2018-05-22 NOTE — Progress Notes (Signed)
Office Visit    Patient Name: Geoffrey [name] Date of Encounter: 05/22/2018  Primary Care Provider:  Mar Daring, PA-C Primary Cardiologist:  Kathlyn Sacramento, MD  Chief Complaint    Geoffrey [name] is a 53 y.o. male with a history of HTN, OSA, palpitations, obesity, left chest schwannoma s/p resection (2015), COPD, with recent hospitalization following Atrial flutter with RVR, presents today with complaints of hemoptysis, melena & epistaxis.  Past Medical History    Past Medical History:  Diagnosis Date  . Allergic rhinitis   . Angio-edema (lisinopril)    a. 04/2018 angioedema in setting of lisinopril rx (had been on for ~ 10 yrs prior but recently refilled before event).  . Asthma   . Basal cell carcinoma of skin   . COPD (chronic obstructive pulmonary disease) ([place])   . Diastolic dysfunction    a. 10/2016 Echo: EF 55-65%. Gr2 DD. Mod LVH. Mildly dil LA/RA. Nl RV fxn; b. 04/2018 Echo: EF 60-65%, mod asymm LVH.  Marland Kitchen Hay fever   . Hypertension   . Morbid obesity ([place])   . PAF (paroxysmal atrial fibrillation) ([place])    a. 04/2018 Admitted w/ Afib and fatigue. Converted during admission; b. CHA2DS2VASc = 1-->eliquis.  . Schwannoma (Left chest wall)    a. 2015 s/p VATS/resection ([facility]).  . Sleep apnea    Past Surgical History:  Procedure Laterality Date  . APPENDECTOMY    . LUNG SURGERY      Allergies  Allergies  Allergen Reactions  . Ibuprofen Anaphylaxis  . Lisinopril Swelling    angioedema  . Amlodipine Other (See Comments)    Restless leg on 10mg   . Aspirin     History of Present Illness    Geoffrey [name] is a 53 year old male with above past medical history including hypertension, sleep apnea with CPAP, palpitations, obesity, left chest schwannoma, COPD, angioedema in the setting of lisinopril and recent hospitalization with Atrial Flutter with RVR (05/13/2018).  He reports a prior history of intermittent chest pain, which was evaluated by PCP & cardiology in  [facility] (2015).  Echocardiogram showed normal LV function with severe LVH.  Renal arterial US was normal, CXR showed a left lung mass, PET scan was negative.  He subsequently underwent VATS at [facility] with resection of the left chest schwannoma.  Postoperatively, he did develop pleural effusion and required thoracentesis in March 2016.  Seen by Dr. Fletcher [name] initially in summer 2018 with complaints of palpitations, dizziness & bradycardia.  Dizziness was felt to be secondary to bradycardia in the setting of verapamil therapy and the dose was halved.   Echocardiogram showed normal LV function with moderate LVH and Gr2DD.  On May 11, 2018 he woke up with significant swelling of his tongue associated with jaw pain and presented to [facility] ED.  He was diagnosed with angioedema in the setting of lisinopril, was treated with Solu-Medrol, Pepcid, Benadryl and EpiPen IM.  Within a few hours he felt better and left AMA.  Interestingly, he had been on lisinopril for approximately 10 years, with a recent refill prior to this episode.  The following morning 05/12/2018, he awoke with recurrent neck and jaw pain and felt his heart racing.  Over the day this evolved into general unwell feeling with c/o fatigue, headache and the sensation of a weight on his chest.  He presented to [facility] ED on 05/13/2018 in Atrial Flutter with RVR.  Labs were notable for troponin elevation of 0.04-0.03.  He was admitted and  placed on heparin therapy with diltiazem 180 mg PO.  He spontaneously converted to NSR during admission but still felt dizzy and fatigued in the setting of sinus bradycardia.  He was discharged on a trial of nifedipine 30 mg daily with eliquis BID, diltiazem was discontinued.  His BP's during this hospitalization were usually between 130's-160's.  He has a history of intolerance to HCTZ and has experienced restless legs with amlodipine.  He presents in NSR today, with elevated BP and a 5 lb weight gain.  He describes overall feeling  better since discharge, but is still experiencing episodes of dizziness throughout the day at variable times.  He also has episodes of palpitations associated with tremors, flushing, dizziness, headache and intermittent chest pain.  He denies any feelings of chest pressure.  He says these episodes of palpitations last for 1-2 minutes and happen on average 1-2 times a day.  He also describes having intermittent episodes of jaw pain associated with a headache that come on at variable times during the day including times of rest.  Overall he feels fatigued throughout the day but also admits to not sleeping well, even though he is wearing his CPAP nightly.  He also reports since discharge coughing up blood and intermittent epistaxis.  He denies hematuria but did say this morning he noticed his stool was black.  He does not use a humidifier on his CPAP but has not had this issue before.  His BP today was 160/70 in the setting of nifedipine.  He usually takes his BP at home but recently lost BP cuff and still needs to replace it.  He reports that a previous BP goal was < 160/90.  He does report experiencing restless leg since starting nifedipine.  He has gained 5 pounds since discharge but reports he has cut out all red meat and is trying to eat heart healthy diet using DASH and the mediterranean diets as a guide.  He does admit to 2 glasses of wine each evening that he is not on call for work.  He denies dyspnea, pnd, orthopnea, n, v, edema, or early satiety.  Home Medications    Prior to Admission medications   Medication Sig Start Date End Date Taking? Authorizing Provider  apixaban (ELIQUIS) 5 MG TABS tablet Take 1 tablet (5 mg total) by mouth 2 (two) times daily. 05/15/18   Loletha Grayer, MD  NIFEdipine (ADALAT CC) 30 MG 24 hr tablet Take 1 tablet (30 mg total) by mouth daily. 05/15/18   Loletha Grayer, MD    Review of Systems    GEN: +intermittent fatigue & dizziness HEENT: +intermittent jaw pain w/  headaches, +epistaxis Cardiac: +palpitaions, +chest pain Respiratory: +hemoptysis GI: +1 black stool    All other systems reviewed and are otherwise negative except as noted above.  Physical Exam    VS:  BP (!) 160/70 (BP Location: Left Arm, Patient Position: Sitting, Cuff Size: Normal)   Pulse 66   Ht 6\' 2"  (1.88 m)   Wt (!) 336 lb 8 oz (152.6 kg)   BMI 43.20 kg/m  , BMI Body mass index is 43.2 kg/m. GEN: Obese, in no acute distress. HEENT: normal. Neck: Supple, no JVD, carotid bruits, or masses. Cardiac: RRR, no murmurs, rubs, or gallops. No clubbing, cyanosis, trace ankle edema above his sock line.  Radials 2+ and equal bilaterally.  Respiratory:  Respirations regular and unlabored, clear to auscultation bilaterally. GI: Obese, Soft, nontender, nondistended, BS + x 4. MS: no deformity or  atrophy. Skin: warm and dry, no rash. Neuro:  Strength and sensation are intact. Psych: Normal affect.  Accessory Clinical Findings    ECG personally reviewed by me today - NSR, 66 with LAD and LVH - no acute changes.  Assessment & Plan    1. Atrial Flutter Recently admitted to [facility] w/ palpitations and finding of aflutter/rvr  converted on IV dilt and developed bradycardia.  D/c'd home off of AVN blocking agent due to bradycardia in sinus.  He is currently in NSR with reports of intermittent palpitations since discharge that are associated with dizziness, tremors, flushing, headache and sometimes mid sternal chest pain.  These episodes resolve within one to two minutes and occur at times of activity and times of rest.  I will place a Zio monitor to eval palpitations and/or recurrent Aflutter, as we would likely consider antiarrhythmic or ablative therapy if he were having recurrences.  As he has had restless legs w/ nifedipine and previously tolerated dilt (was on 180 daily prior to hospitalization), I will d/c nifedipine and resume dilt @ 120mg  daily.  He was instructed to check his HR at home  and to call the office if his HR drops below 50 bpm.  He has also reported sinus congestion and cough with blood tinged secretions, along with epistaxis and gingival bleeding.  Further, this AM, he had a dark stool.  Though he was recently in aflutter, he did not require cardioversion and CHA2DS2VASc is only 1.  Our initial plan was to continue [place] x 4 wks, however given concern for bleeding, I have asked him to hold eliquis and will f/u a CBC and CXR today.  2. Essential Hypertension BP elevated today at 160/70 in the setting of nifedipine.  He is describing symptoms of restless leg since discharge.  He has a known history of intolerance to HCTZ, restless leg symptoms on amlodipine, and recent suspected angioedema reaction with lisinopril.  In the setting of recurrent palpitations, I have asked him to d/c nifedipine and resume low dose dilt.  Instructed to take BP at home daily for 2 weeks and check HR.  I suspect that he will require additional antihypertensive rx - likely hydralazine.  3.  Elevated troponin Pt had mild trop elevation w/ flat trend during admission (0.04-0.03-0.03-0.04).   He did not have chest pain prior to admission or since.  Echo showed nl EF during hospitalization.  We can consider an ischemic evaluation in the future - will hold off at this time in the setting of other ongoing symptoms and w/u.  4. OSA Compliant w/ CPAP.  5.  Melena/epistaxis/hemoptysis/gingival bleeding Since starting eliquis, he has been having intermittent bloody sputum, epistaxis, and gingival bleeding.  1 dark stool this AM.  See #1. Holding eliquis for the time being.  F/u cxr and CBC.  He will require GI referral  - he's never had a colonoscopy.  6.  Dispo CBC and CXR ordered today.  Apixaban & Nifedipine discontinued, diltiazem prescribed.  Patient instructed to check BP & HR at home and to call if HR drops below 50.  Follow up in 3 weeks.  Murray Hodgkins, NP 05/22/2018, 12:14 PM

## 2018-05-23 LAB — CBC WITH DIFFERENTIAL/PLATELET
Basophils Absolute: 0 10*3/uL (ref 0.0–0.2)
Basos: 0 %
EOS (ABSOLUTE): 0.2 10*3/uL (ref 0.0–0.4)
Eos: 3 %
Hematocrit: 46.1 % (ref 37.5–51.0)
Hemoglobin: 15.8 g/dL (ref 13.0–17.7)
IMMATURE GRANS (ABS): 0 10*3/uL (ref 0.0–0.1)
Immature Granulocytes: 1 %
LYMPHS: 22 %
Lymphocytes Absolute: 1.7 10*3/uL (ref 0.7–3.1)
MCH: 31 pg (ref 26.6–33.0)
MCHC: 34.3 g/dL (ref 31.5–35.7)
MCV: 90 fL (ref 79–97)
Monocytes Absolute: 0.9 10*3/uL (ref 0.1–0.9)
Monocytes: 12 %
Neutrophils Absolute: 4.9 10*3/uL (ref 1.4–7.0)
Neutrophils: 62 %
Platelets: 206 10*3/uL (ref 150–450)
RBC: 5.1 x10E6/uL (ref 4.14–5.80)
RDW: 14.2 % (ref 11.6–15.4)
WBC: 7.8 10*3/uL (ref 3.4–10.8)

## 2018-05-26 ENCOUNTER — Telehealth: Payer: Self-pay | Admitting: Nurse Practitioner

## 2018-05-26 DIAGNOSIS — I10 Essential (primary) hypertension: Secondary | ICD-10-CM

## 2018-05-26 DIAGNOSIS — Z79899 Other long term (current) drug therapy: Secondary | ICD-10-CM

## 2018-05-26 MED ORDER — SPIRONOLACTONE 25 MG PO TABS: 12.5000 mg | ORAL_TABLET | Freq: Every day | ORAL | 2 refills | Status: DC

## 2018-05-26 NOTE — Telephone Encounter (Signed)
Patient calling back. BP readings were taken about 2-3 hours after taking diltiazem. States he was feeling terrible all day. He described it as feeling flushed, headache and dizziness. Denies blurred vision and chest pain.  Reports still having crusty, blood nose. He feels he needs something else for his BP. Advised I will send to provider for advice and let him know the recommendations.

## 2018-05-26 NOTE — Telephone Encounter (Signed)
Pt is calling with BP readings 2/28-162/90 70 HR 2/29-159/89 71 HR 3/1 -155/90 62 HR, 12 pm 177/102 69 HR, 7:30 pm 182/99 HR 67 3/2 171/100 HR 66

## 2018-05-26 NOTE — Telephone Encounter (Signed)
No answer. Left message to call back.   

## 2018-05-26 NOTE — Telephone Encounter (Signed)
Blood pressures appear to be mostly in line with what he reported as typical for him.  HR's stable on low dose dilt.  Please add spironolactone 12.5mg  daily. F/u bmet in a week.

## 2018-05-26 NOTE — Telephone Encounter (Signed)
Patient verbalized understanding to start spironolactone 12.5 mg (0.5 tablet) by mouth once a day. He will come to our office on 3/9 at 10 am for BMET. Questions and concerns addressed. No further questions at this time. Rx sent to pharmacy.

## 2018-05-29 MED ORDER — HYDRALAZINE HCL 10 MG PO TABS: 10.0000 mg | ORAL_TABLET | Freq: Three times a day (TID) | ORAL | 3 refills | Status: DC

## 2018-05-29 NOTE — Addendum Note (Signed)
Addended by: Vanessa Ralphs on: 05/29/2018 10:52 AM   Modules accepted: Orders

## 2018-05-30 ENCOUNTER — Ambulatory Visit: Payer: Managed Care, Other (non HMO) | Admitting: Physician Assistant

## 2018-06-02 ENCOUNTER — Telehealth: Payer: Self-pay | Admitting: Cardiovascular Disease

## 2018-06-02 ENCOUNTER — Other Ambulatory Visit (INDEPENDENT_AMBULATORY_CARE_PROVIDER_SITE_OTHER): Payer: Managed Care, Other (non HMO)

## 2018-06-02 DIAGNOSIS — Z79899 Other long term (current) drug therapy: Secondary | ICD-10-CM

## 2018-06-02 DIAGNOSIS — I10 Essential (primary) hypertension: Secondary | ICD-10-CM | POA: Diagnosis not present

## 2018-06-02 MED ORDER — HYDRALAZINE HCL 10 MG PO TABS: 20.0000 mg | ORAL_TABLET | Freq: Three times a day (TID) | ORAL | 3 refills | Status: DC

## 2018-06-02 NOTE — Telephone Encounter (Signed)
bp's remain elevated, though it's hard to know if elevated pressures are causing symptoms.  He may take 2, 10mg  (total 20mg ) hydralazine tabs TID to address his bp with plan for f/u on Thursday.  He is wearing a zio monitor.  I wonder if some of his symptoms are secondary to arrhythmia/recurrent atrial flutter, which if present on zio, will result in EP referral.

## 2018-06-02 NOTE — Telephone Encounter (Signed)
Patient verbalized understanding of recommendations and to increase hydralazine to 20 mg three times a day. He stated he understand that everything takes time to figure out concerning his BP and causes. He is having trouble putting his mind at ease. Advised him to adjust medications, continue to monitor and call us if needed. He verbalized understanding to take BP after sitting to rest for 5-10 minutes and 2 hours after taking BP med.

## 2018-06-02 NOTE — Telephone Encounter (Signed)
Patient here in person in the office today. He has lab work scheduled. He is also requesting his BP to be checked.   Rn notified and spoke with patient and significant other.  He has been having off and on headaches throughout the day along with "hard to catch breath" and sweating.  He is not having those symptoms right at this moment.  He is very concerned about his elevated BP.  States he gets dizzy, experiences flushing intermittently as well.  States he will look down at his left and and it will be curled up at times. He expressed his job is very busy. 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 Reports gaining weight despite making healthy diet changes such as no white starches, eating lean meats and vegetables.  Weight today at home was 338lb.  States he is taking his medications as prescribed on his med list.   Advised him I would recommend taking him to the ED at this time if his symptoms are present at this time. He denied. Offered he could take next available appointment. Offered him appointments on Tuesday and Wednesday but patient unable due to work scheduled. Offered Thursday with Thurmond Butts and he accepted.  Advised I will also send this information to provider for review.

## 2018-06-02 NOTE — Telephone Encounter (Signed)
Pt c/o BP issue: STAT if pt c/o blurred vision, one-sided weakness or slurred speech  1. What are your last 5 BP readings?    3/6 6 am   162/93  56  3/6  6 pm   179/94  68  3/7 4 am   174/87  71  3/7 5 pm   163/93  79   3/8 530 am 171/95  100  3/8 11 pm  174/95  76  3/9 615 am 174/95  68  2. Are you having any other symptoms (ex. Dizziness, headache, blurred vision, passed out)? Speech issues interim pounding headache pounding heart hard to catch breath episodes of sweating  Shaky , sometimes double vision noticed weight gain (5 lbs)  3. What is your BP issue? Elevated bp patient teary eyed and visibly stressed and nervous about current symptoms and wants this to be addressed. Waiting in [place]

## 2018-06-03 ENCOUNTER — Telehealth: Payer: Self-pay | Admitting: *Deleted

## 2018-06-03 LAB — BASIC METABOLIC PANEL
BUN/Creatinine Ratio: 16 (ref 9–20)
BUN: 10 mg/dL (ref 6–24)
CO2: 23 mmol/L (ref 20–29)
Calcium: 10 mg/dL (ref 8.7–10.2)
Chloride: 101 mmol/L (ref 96–106)
Creatinine, Ser: 0.61 mg/dL — ABNORMAL LOW (ref 0.76–1.27)
GFR calc Af Amer: 132 mL/min/{1.73_m2} (ref >59)
GFR calc non Af Amer: 114 mL/min/{1.73_m2} (ref >59)
GLUCOSE: 104 mg/dL — AB (ref 65–99)
Potassium: 5.3 mmol/L — ABNORMAL HIGH (ref 3.5–5.2)
Sodium: 138 mmol/L (ref 134–144)

## 2018-06-03 NOTE — Telephone Encounter (Signed)
Called patient after speaking with Ignacia Bayley, NP. See telephone note.

## 2018-06-03 NOTE — Telephone Encounter (Signed)
Results called to pt. Pt verbalized understanding of results and to stop spironolactone. We discussed recent BPs.  This morning upon waking up was 138/83, 67 before medications. At 1 pm today, 147/88,  74 prior to taking afternoon hydralazine.  Patient only took 20 mg hydralazine one time yesterday afternoon. He became flushed, sweating, beet red in the face and panting about 2 hours later. He was afraid last night to take the 20 mg again so only took 10 mg last night and ongoing. Today, he feels better. Still has some "panting" is what he describes with being short of breath. He was very excited that his BP was decreasing.  Patient unable to make appointment this Thursday. Discussed with Ignacia Bayley, NP other possible appt times. Also reviewed patient and I was advised that if patient's BP is stable at this time, then could cancel appointment Thursday and keep plan to f/u 06/12/18 to review ZIO results.  Patient agreeable to this plan. He will be removing ZIO on Thursday and mailing back to company. He was very appreciative and will notify us if any new questions or concerns arise.

## 2018-06-03 NOTE — Telephone Encounter (Signed)
-----   Message from Theora Gianotti, NP sent at 06/03/2018  7:53 AM EDT ----- Renal fxn ok.  K is milldy elevated - most likely 2/2 spironolactone.  Please d/c spiro.

## 2018-06-03 NOTE — Telephone Encounter (Signed)
Thank you for update. It's not entirely clear why he would tolerate hydralazine 10mg  but not 20 - even with elevated bp's after taking.  I continue to suspect that some of his symptoms may be arrhythmia related and look forward to seeing what the [facility] will show [place].

## 2018-06-04 ENCOUNTER — Ambulatory Visit (INDEPENDENT_AMBULATORY_CARE_PROVIDER_SITE_OTHER): Payer: Managed Care, Other (non HMO) | Admitting: Nurse Practitioner

## 2018-06-04 ENCOUNTER — Other Ambulatory Visit: Payer: Self-pay

## 2018-06-04 ENCOUNTER — Encounter: Payer: Self-pay | Admitting: Nurse Practitioner

## 2018-06-04 VITALS — BP 186/70 | HR 93 | Ht 74.0 in | Wt 337.5 lb

## 2018-06-04 DIAGNOSIS — I10 Essential (primary) hypertension: Secondary | ICD-10-CM | POA: Diagnosis not present

## 2018-06-04 DIAGNOSIS — K921 Melena: Secondary | ICD-10-CM | POA: Diagnosis not present

## 2018-06-04 DIAGNOSIS — I4892 Unspecified atrial flutter: Secondary | ICD-10-CM

## 2018-06-04 MED ORDER — CLONIDINE HCL 0.1 MG PO TABS: 0.1000 mg | ORAL_TABLET | Freq: Two times a day (BID) | ORAL | 11 refills | Status: DC

## 2018-06-04 NOTE — Telephone Encounter (Signed)
Call to patient to discuss my chart message received from patient,  "I am really tired of this stuff.I cant function , I am on the verge of telling you guys to put me in the hospital and dont let me out until I am better. 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 He feels they are worse immediately after taking hydralazine but get better as it closer to taking next dose.  He reported VS Am 1.5 hrs after med BP 188/90, HR 85  Lunchtime BP 155/89, HR 75 HR 69-104  Message from 10 AM this morning from patient,  " I woke up with crushing headache, it went away in about an hour but my heart beat was fast and erratic. Headache went away , back to sleep, woke me up again.  I was up this morning and took both my pills as normal and went to work. I started out light headed with fast heat beat and have progressed into shaking, sweating. This has been the last 3 hours. Now while typing this, the shakes went away and I feel somewhat better. I will go home, take my BP and then send that stuff on to you. I would like to think that this stuff is going to stop at some point but honestly I am wondering if this regiment is the right one for me. I agree that we should keep going for a bit more to see if I can get it under control  "  Spoke verbally to Ignacia Bayley, NP who has been managing his care. He requested that patient come into clinic this afternoon.   Pt agreeable and placed on schedule this afternoon.

## 2018-06-04 NOTE — Progress Notes (Signed)
Office Visit    Patient Name: Geoffrey [name] Date of Encounter: 06/04/2018  Primary Care Provider:  Mar Daring, PA-C Primary Cardiologist:  Kathlyn Sacramento, MD  Chief Complaint    53 year old male with a history of HTN, OSA, palpitations, obesity, left chest schwannoma s/p resection (2015), COPD presents today with complaints of intermittent flushing, headache & shaking in the setting of initiating hydralyzine to better control essential hypertension.  Past Medical History    Past Medical History:  Diagnosis Date   Allergic rhinitis    Angio-edema (lisinopril)    a. 04/2018 angioedema in setting of lisinopril rx (had been on for ~ 10 yrs prior but recently refilled before event).   Asthma    Basal cell carcinoma of skin    COPD (chronic obstructive pulmonary disease) (HCC)    Diastolic dysfunction    a. 10/2016 Echo: EF 55-65%. Gr2 DD. Mod LVH. Mildly dil LA/RA. Nl RV fxn; b. 04/2018 Echo: EF 60-65%, mod asymm LVH.   Hay fever    Hypertension    Morbid obesity ([place])    PAF (paroxysmal atrial fibrillation) ([place])    a. 04/2018 Admitted w/ Afib and fatigue. Converted during admission; b. CHA2DS2VASc = 1-->eliquis.   Schwannoma (Left chest wall)    a. 2015 s/p VATS/resection ([facility]).   Sleep apnea    Past Surgical History:  Procedure Laterality Date   APPENDECTOMY     LUNG SURGERY      Allergies  Allergies  Allergen Reactions   Ibuprofen Anaphylaxis   Lisinopril Swelling    angioedema   Amlodipine Other (See Comments)    Restless leg on 10mg    Aspirin     History of Present Illness    Geoffrey [name] is a 53 year old male with above past medical history including HTN, OSA w/ CPAP, palpitations, obesity, left chest schwannoma, COPD, angioedema in the setting of lisinopril and recent hospitalization with Atrial Flutter with RVR (05/13/2018).  He has a prior history of intermittent chest pain, which was evaluated by PCP & cardiology in [facility]  (2015).  Echocardiogram at that time showed normal LV function with severe LVH.  Renal arterial US was normal, CXR showed a left lung mass, PET scan negative.  Subsequently, he underwent VATS at [facility] with resection of the left chest schwannoma.  Postoperatively, he developed a pleural effusion requiring thoracentesis (March 2016).  Evaluated at this office in summer of 2018 with complaints of palpitations, dizziness & bradycardia.  Dizziness was thought to be secondary to bradycardia in the setting of verapamil therapy, and the dose was halved.  Echocardiogram showed normal LV function with moderate LVH & Gr2DD.    In February 2020 he had an episode of angioedema in the setting of lisinopril.  He was treated successfully, went home AMA, only to return the following day with symptoms of neck/jaw pain, fatigue, headache, a general unwell feeling and the sensation of a weight on his chest.  Upon returning to [facility] ED he was found to be in Atrial Flutter with RVR and was admitted.  He spontaneously converted on IV diltiazem to NSR during admission, but felt dizzy & fatigued in the setting of sinus bradycardia.  He was discharged on a trial of nifedipine 30 mg QD & eliquis with diltiazem discontinued. At his last visit he had continued complaints of intermittent palpitations associated with dizziness, headache, tremors, flushing and intermittent chest pain.  These episodes lasted 1-2 minutes, happening 1-2 times a day.  Additionally, he was having separate intermittent episodes of jaw pain associated with a headache that happened at variable times during the day, including at rest.  He also reported new symptoms of hemoptysis, epistaxis, gingival bleeding, and 1 black stool while on eliquis- follow up CBC and CXR were normal.  He was placed on a Zio monitor to evaluate palpitations and/or recurrent Aflutter and eliquis was discontinued.  BP was elevated at 160/70 in the setting of nifedipine and he also described have  some restless leg symptoms since discharge.  He did state that his previous BP goal was < 160/90 and he had not been checking his BP regularly.  The nifedipine was discontinued and he was resumed on diltiazem 120 mg QD with f/u scheduled for March 19th.  There was discussion that he may need additional agents to control his BP going forward.   Since the last visit, he has been taking his BP at home at least 3x/day, with readings between 150's-180's over 80's-100's.  He called 05/26/2018 reporting ongoing HTN and was placed on spironolactone 12.5 mg QD.  On 05/29/2018 he called back with BP's similar range and was placed on hydralazine 10 mg TID in addition to diltiazem and spironolactone.  On 06/02/2018 he called back with BP's in similar ranges as above and requested additional help, hydralazine was increased to 20 mg TID.  On 06/03/2018 he called back reporting symptoms of new severe headache, flushing, sweating & dyspnea after taking increased dose, dose was decreased back to 10 mg TID.  His BMET results came back as well on 3/10 showing elevated K+ of 5.3 on low dose spironolactone and was discontinued.  Continuing to have these above listed symptoms today, the patient requested to be seen.  He is concerned about his elevated systolic BP remaining between 150's and 180's.  He described new symptoms of flushing, tremors, severe headache and dyspnea associated with doses of his hydralazine.  However, he does admit that these symptoms are somewhat transient and don't always happen after a dose.  He also reports intermittent palpitations that are not associated with any additional symptoms, as well as an intermittent "dazed" feeling.  He is also concerned that after enforcing diet restrictions he states he has gained 5 pounds.  He denies chest pain, pnd, orthopnea, n, v, dizziness, syncope, edema, or early satiety.  He denies gingival bleeding, epistaxis, hemoptysis and black stool, stating these symptoms ceased 4 days  after stopping eliquis.  His Zio monitor will be mailed back tomorrow.  Home Medications    Prior to Admission medications   Medication Sig Start Date End Date Taking? Authorizing Provider  diltiazem (CARDIZEM CD) 120 MG 24 hr capsule Take 1 capsule (120 mg total) by mouth daily. 05/22/18  Yes Theora Gianotti, NP  hydrALAZINE (APRESOLINE) 10 MG tablet Take 2 tablets (20 mg total) by mouth 3 (three) times daily. Patient taking differently: Take 10 mg by mouth 3 (three) times daily.  06/02/18  Yes Theora Gianotti, NP    Review of Systems    Cardiac: He reports intermittent palpitations, dyspnea in the setting of tremors/flushing/headache associated with hydralazine doses.  He denies chest pain, pnd, orthopnea, n, v, dizziness, syncope, edema, or early satiety. All other systems reviewed and are otherwise negative except as noted above.  Physical Exam    VS:  BP (!) 186/70 (BP Location: Left Arm, Patient Position: Sitting, Cuff Size: Large)    Pulse 93    Ht 6\' 2"  (1.88 m)  Wt (!) 337 lb 8 oz (153.1 kg)    BMI 43.33 kg/m  , BMI Body mass index is 43.33 kg/m. GEN: Obese, in no acute distress. HEENT: normal. Neck: Supple, no JVD, carotid bruits, or masses. Cardiac: RRR, no murmurs, rubs, or gallops. No clubbing, cyanosis, edema.   Respiratory:  Respirations regular and unlabored, clear to auscultation bilaterally. GI: Soft, nontender, nondistended, BS + x 4. MS: no deformity or atrophy. Skin: warm and dry, no rash. Neuro:  Strength and sensation are intact. Psych: Normal affect.  Accessory Clinical Findings    ECG personally reviewed by me today - NSR, 93, LAD - no acute changes.  Assessment & Plan    1.  Essential Hypertension:  BP remains elevated at home with systolic between 671'I and 180's.  He has a history of intolerance to HCTZ, restless leg symptoms on amlodipine & nifedipine, angioedema in the setting of lisinopril, increased K+ with low dose  spironolactone.  Here today with reported new symptoms of flushing/tremors/headache & dyspnea transiently associated with doses of hydralazine.  At outset of today's visit BP was markedly elevated at 186/70 with a heart rate of 93 and at times he was noticeably shaking.  During our conversation, he started to relax and at the end of the visit his BP was 148/63.  Due to reported symptoms listed above, hydralazine will be discontinued.  Initiated clonidine 0.1 mg BID to start this evening in order to avoid rebound hypertension in the setting of discontinuing hydralazine.  Instructed to take his BP daily for one week and then to report values in order to make necessary medication titration as needed.  He was reminded that it might take a week for this medicine to make an impact on his BP.  2. Palpitations/recurrent Atrial Flutter:  Past history of c/o palpitations with recent hospitalization for Aflutter RVR (04/2018).  Placed on Zio monitor after c/o continuing palpitations post discharge.  Continued on diltiazem in addition for coverage of palpitations/recurrent Aflutter while awaiting Zio monitor results next week.  I remain concerned that some of his multiple symptoms may be linked to PAFlutter, though he reported fluttering and palpitations upon arrival today and while his BP was being checked however, ECG shows sinus rhythm.  Discussed consideration of EP referral if Zio shows recurrent episodes of Aflutter and need then to restart eliquis (CHA2DS2VASc = 1 - eliquis prev d/c'd 2/2 gingival bleeding/epistaxis/1 dark stool).    3.  Elevated Troponin:  He had mild elevation with flat trend: 0.4 0.03 0.03  0.04 during last admission (04/2018).  He did not have chest pain prior to admission, echo showed nl LV function.  We should consider an ischemic evaluation in the future- will hold off at this time in the setting of other ongoing symptoms and workup.  4.  OSA:  Compliant with CPAP nightly.  5.  Melena:   Noted dark stool x 1 while on eliquis following hospitalization.  This was also in the setting of gingival bleeding and epistaxis.  H/H were wnl @ last visit.  Discussed need for f/u with GI/PCP after previous black stool in the setting of eliquis.  He has never had a colonoscopy and was encouraged today that this would be an important f/u for him to rule out other abnormalities.  6.  Disposition:  1 hr spent with pt and wife today discussing his current issues.  F/u appointment already scheduled for next week.  Instructed to take BP daily for one week before  any medication adjustments would need to be made.  Zio monitor will be mailed back by patient tomorrow and we will be able to review results @ the next appt.  Murray Hodgkins, NP 06/04/2018, 4:50 PM

## 2018-06-04 NOTE — Patient Instructions (Signed)
Medication Instructions:  Your physician has recommended you make the following change in your medication:  1- STOP Hydralazine. 2- START Clonidine 0.1 mg (1 tablet) by mouth two times a day.  If you need a refill on your cardiac medications before your next appointment, please call your pharmacy.   Lab work: none If you have labs (blood work) drawn today and your tests are completely normal, you will receive your results only by: Marland Kitchen MyChart Message (if you have MyChart) OR . A paper copy in the mail If you have any lab test that is abnormal or we need to change your treatment, we will call you to review the results.  Testing/Procedures: none  Follow-Up: At [facility], you and your health needs are our priority.  As part of our continuing mission to provide you with exceptional heart care, we have created designated Provider Care Teams.  These Care Teams include your primary Cardiologist (physician) and Advanced Practice Providers (APPs -  Physician Assistants and Nurse Practitioners) who all work together to provide you with the care you need, when you need it. You will need a follow up appointment KEEP appointment AS SCHEDULED.

## 2018-06-05 ENCOUNTER — Ambulatory Visit: Payer: Managed Care, Other (non HMO) | Admitting: Physician Assistant

## 2018-06-10 ENCOUNTER — Encounter: Payer: Self-pay | Admitting: Nurse Practitioner

## 2018-06-10 ENCOUNTER — Telehealth: Payer: Self-pay

## 2018-06-10 NOTE — Telephone Encounter (Signed)
Call to patient to reschedule in response to COVID 19 precautions. I explained to patient that according to current guidelines that we will push out all appts that aren't emergent.   He reported that he was feeling great after recent medication changes and had no complaints at this time.   He is agreeable to POC and I will route to pool for rescheduling.   Advised pt to call for any further questions or concerns.

## 2018-06-12 ENCOUNTER — Ambulatory Visit: Payer: Managed Care, Other (non HMO) | Admitting: Nurse Practitioner

## 2018-06-26 NOTE — Telephone Encounter (Signed)
Spoke with patient. He is very pleased with the medication changes and prefers to wait until able to see Dr. Fletcher Anon in person. I advised him that this may be several months and to call our office if any concerns arise before that time.

## 2018-07-06 IMAGING — US US EXTREM UP *R* LTD
2 series · 14 of 19 positions shown · non-contrast
Comparison: None.

CLINICAL DATA: Right ring and little finger pain radiating into the
palm and forearm after hearing a pop at work two weeks ago while
holding a pair of pliers.

EXAM:
ULTRASOUND RIGHT UPPER EXTREMITY LIMITED
TECHNIQUE: Ultrasound examination of the upper extremity soft tissues was
performed in the area of clinical concern.

[Series 1: us extrem up *right* ltd · 18 acquisitions, 13 frames shown (1 of 2)]
[im 1/18]
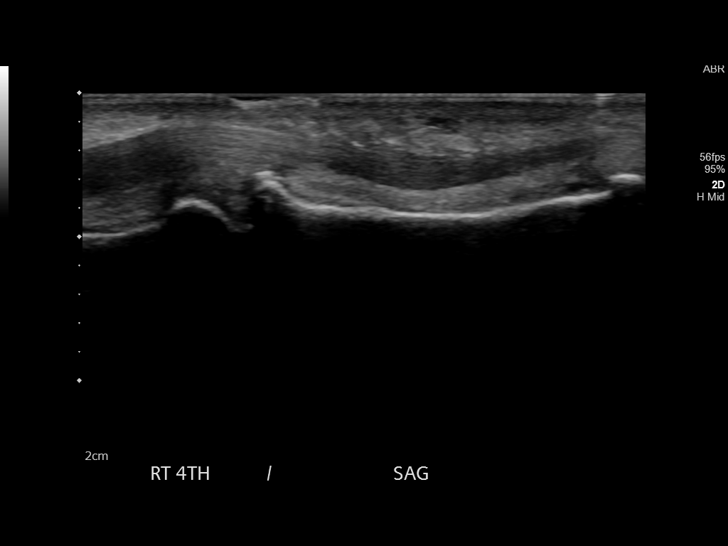
[im 3/18]
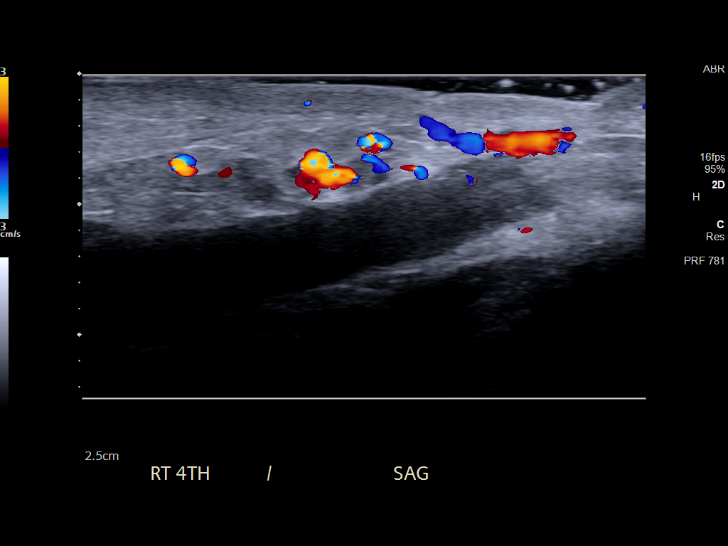
[im 4/18]
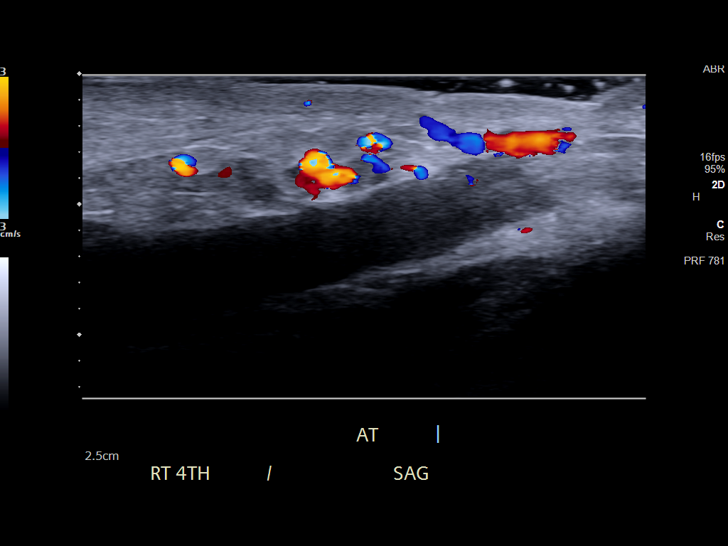
[im 5/18]
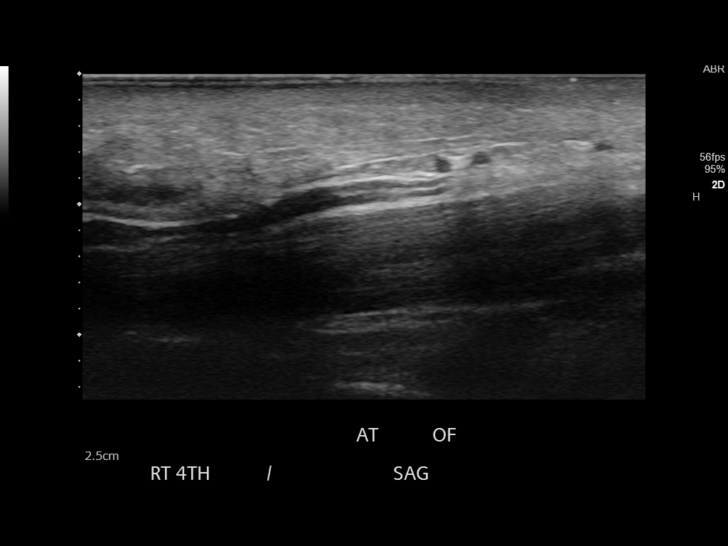
[im 7/18]
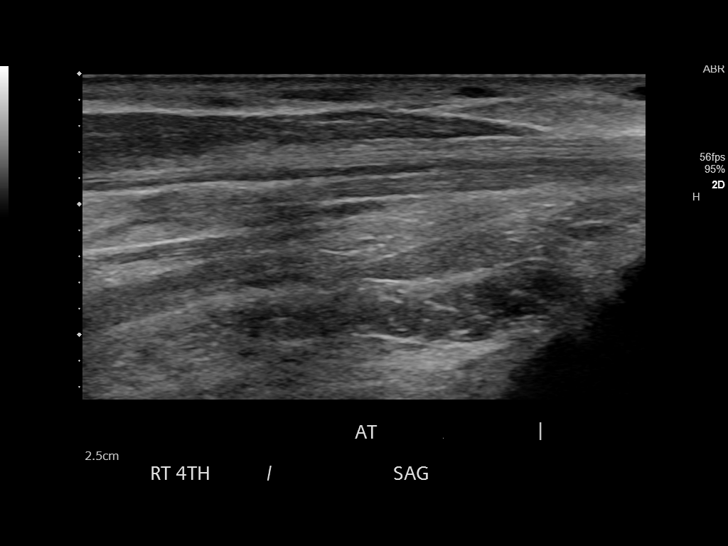
[im 8/18]
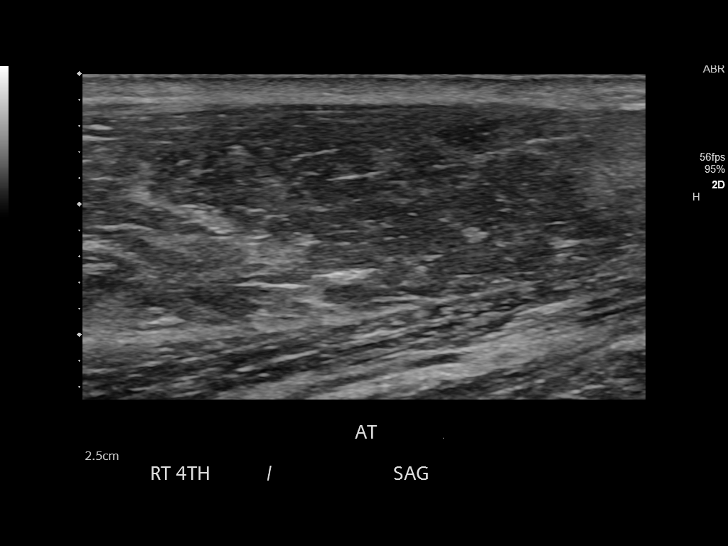
[im 9/18]
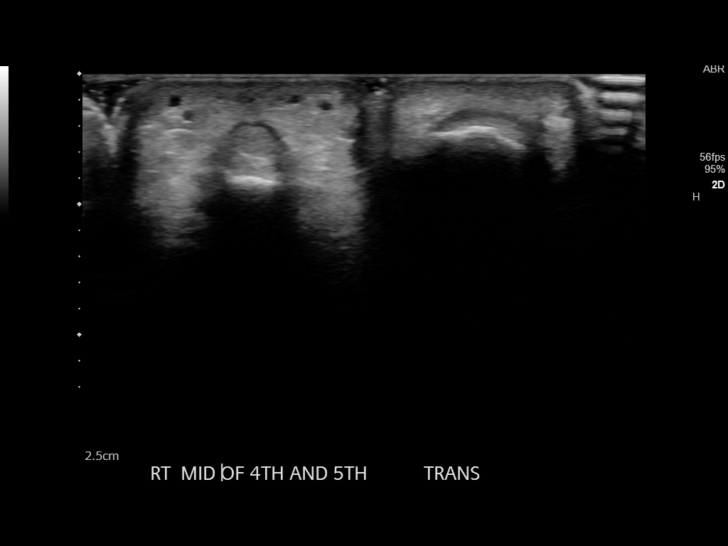
[im 11/18]
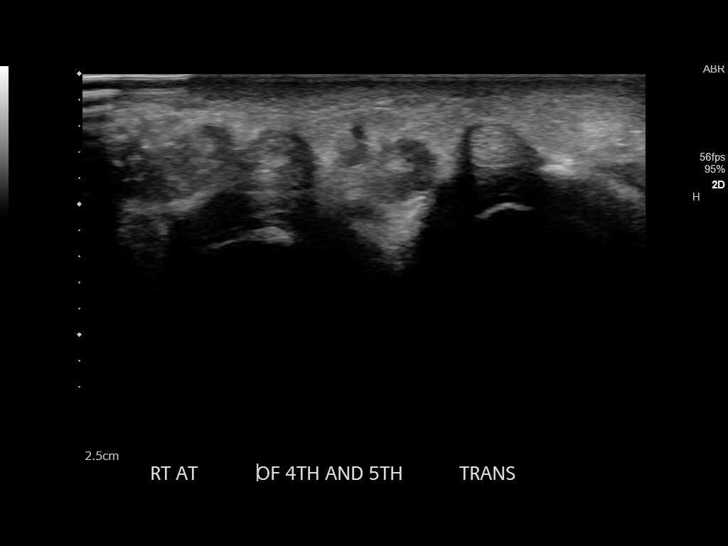
[im 12/18]
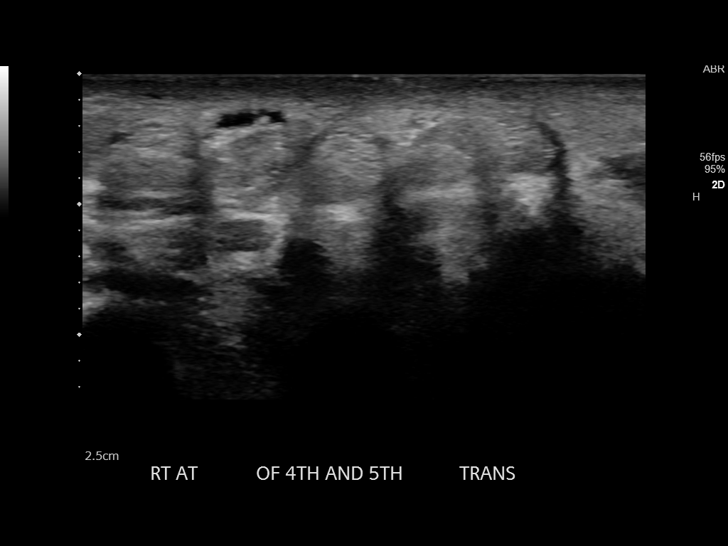
[im 13/18]
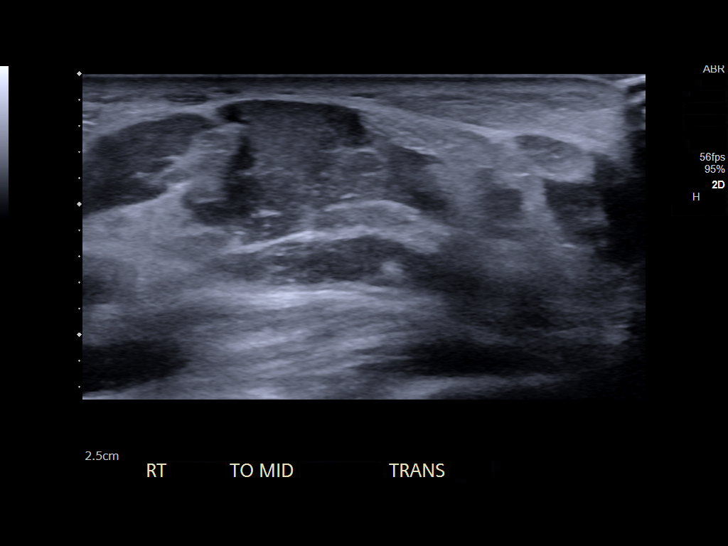
[im 15/18]
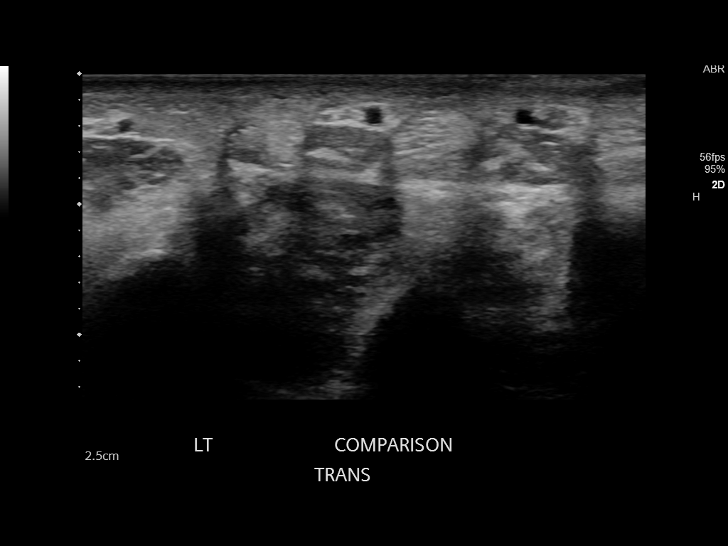
[im 16/18]
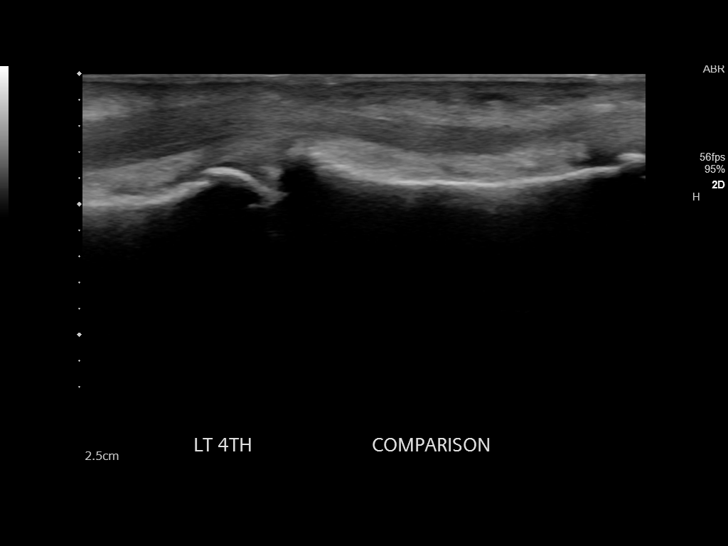
[im 17/18]
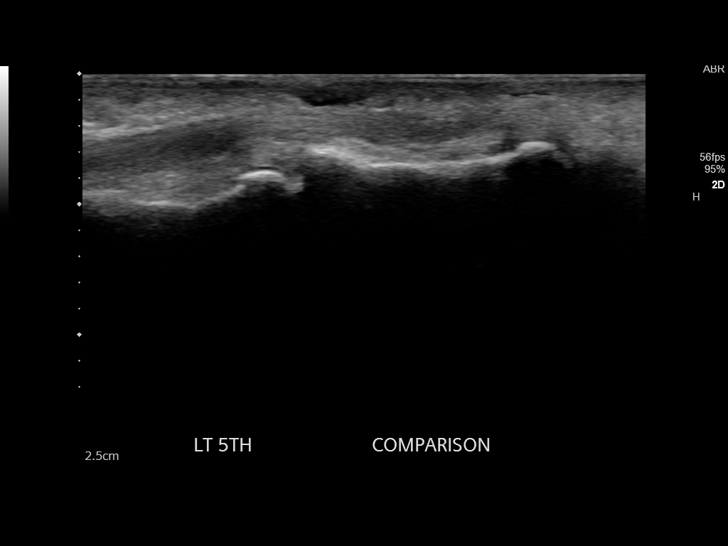

[Series 2: us extrem up *right* ltd · 1 of 331 frames shown (2 of 2)]
[frame 166/331]
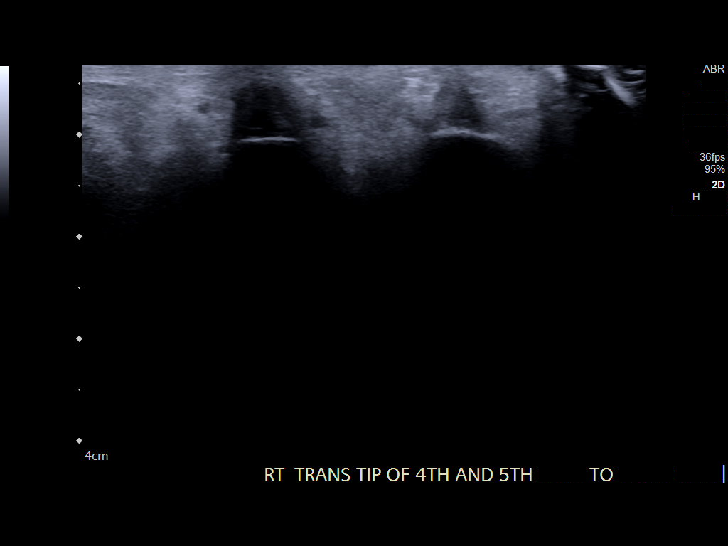

[14 of 19 positions shown; findings below may reference images not displayed]

FINDINGS: Focused ultrasound along the volar aspect of the ring and little
fingers as well as the palmar area and ulnar-sided wrist areas
demonstrates intact ring and little finger flexor tendons. The
flexor tendons are adequately opposed to the underlying phalanges,
without evidence of significant pulley injury. No evidence of
tenosynovitis. There is no soft tissue mass or fluid collection.
IMPRESSION: 1. No acute abnormality identified.

## 2018-07-07 ENCOUNTER — Ambulatory Visit: Payer: Managed Care, Other (non HMO) | Admitting: Physician Assistant

## 2019-03-02 ENCOUNTER — Other Ambulatory Visit: Payer: Self-pay | Admitting: *Deleted

## 2019-03-02 MED ORDER — CLONIDINE HCL 0.1 MG PO TABS: 0.1000 mg | ORAL_TABLET | Freq: Two times a day (BID) | ORAL | 3 refills | Status: DC

## 2019-03-02 MED ORDER — DILTIAZEM HCL ER COATED BEADS 120 MG PO CP24: 120.0000 mg | ORAL_CAPSULE | Freq: Every day | ORAL | 0 refills | Status: DC

## 2019-03-10 ENCOUNTER — Other Ambulatory Visit: Payer: Self-pay | Admitting: *Deleted

## 2019-03-10 MED ORDER — DILTIAZEM HCL ER COATED BEADS 120 MG PO CP24: 120.0000 mg | ORAL_CAPSULE | Freq: Every day | ORAL | 0 refills | Status: DC

## 2019-03-30 ENCOUNTER — Other Ambulatory Visit: Payer: Self-pay | Admitting: *Deleted

## 2019-03-30 MED ORDER — CLONIDINE HCL 0.1 MG PO TABS: 0.1000 mg | ORAL_TABLET | Freq: Two times a day (BID) | ORAL | 3 refills | Status: DC

## 2019-04-23 ENCOUNTER — Telehealth: Payer: Self-pay | Admitting: Cardiovascular Disease

## 2019-04-23 NOTE — Telephone Encounter (Signed)
Message routed to Dr. Fletcher Anon to see if he has any recommendations.

## 2019-04-23 NOTE — Telephone Encounter (Signed)
Patient has moved to [place], [place] and will not be continuing care with our office. Patient would like any recommendations on a cardiologist in that area. Advised patient to also contact his insurance for advice. He values the opinion of Dr. Fletcher Anon and staff  Patient also would like to thank everyone who cared for him!  Please advise

## 2019-04-29 NOTE — Telephone Encounter (Signed)
I did temporary cardiology work before in that area. There are multiple cardiology options through [facility] clinic or [facility]. If he prefers a smaller group, there is [facility] cardiology.  I don't know them personally so I can not make a specific recommendation.

## 2019-04-29 NOTE — Telephone Encounter (Signed)
Spoke with the patient and made him aware of Dr. Tyrell Antonio response and recommendation. Patient voiced appreciation for the assistance.

## 2019-06-17 ENCOUNTER — Other Ambulatory Visit: Payer: Self-pay

## 2019-06-17 MED ORDER — CLONIDINE HCL 0.1 MG PO TABS: 0.1000 mg | ORAL_TABLET | Freq: Two times a day (BID) | ORAL | 3 refills | Status: DC

## 2019-06-18 ENCOUNTER — Other Ambulatory Visit: Payer: Self-pay

## 2019-06-18 MED ORDER — DILTIAZEM HCL ER COATED BEADS 120 MG PO CP24: 120.0000 mg | ORAL_CAPSULE | Freq: Every day | ORAL | 0 refills | Status: DC

## 2019-09-15 ENCOUNTER — Other Ambulatory Visit: Payer: Self-pay | Admitting: Cardiovascular Disease

## 2019-09-16 ENCOUNTER — Telehealth: Payer: Self-pay | Admitting: Cardiovascular Disease

## 2019-09-16 NOTE — Telephone Encounter (Signed)
Patient has moved to [place].  Refilled medication in March for patient and made patient aware that cardiology in [place] would need to start filling medication.   Please advise if I should refill medication again.

## 2019-09-16 NOTE — Telephone Encounter (Signed)
If we sent in 90 day supply and instructions to see provider there for refills then he should check with them.

## 2019-09-16 NOTE — Telephone Encounter (Signed)
diltiazem (CARDIZEM CD) 120 MG 24 hr capsule 90 capsule 0 06/18/2019    Sig - Route: Take 1 capsule (120 mg total) by mouth daily. - Oral   Sent to pharmacy as: diltiazem (CARDIZEM CD) 120 MG 24 hr capsule   Notes to Pharmacy: Pt's Cardiologist in [place] will need to start refilling this medication.   E-Prescribing Status: Receipt confirmed by pharmacy (06/18/2019  3:15 PM EDT)   Pharmacy  [id number] - [place], [place] - [address]

## 2019-09-16 NOTE — Telephone Encounter (Signed)
*  STAT* If patient is at the pharmacy, call can be transferred to refill team.   1. Which medications need to be refilled? (please list name of each medication and dose if known) diltiazem 120 daily, clonidine .1 mg bid  2. Which pharmacy/location (including street and city if local pharmacy) is medication to be sent to? [facility] [address], [place] [address]  3. Do they need a 30 day or 90 day supply? [place]

## 2019-09-17 ENCOUNTER — Other Ambulatory Visit: Payer: Self-pay

## 2019-09-17 MED ORDER — DILTIAZEM HCL ER COATED BEADS 120 MG PO CP24: 120.0000 mg | ORAL_CAPSULE | Freq: Every day | ORAL | 0 refills | Status: DC

## 2019-09-17 MED ORDER — CLONIDINE HCL 0.1 MG PO TABS: 0.1000 mg | ORAL_TABLET | Freq: Two times a day (BID) | ORAL | 0 refills | Status: DC

## 2019-09-17 NOTE — Telephone Encounter (Signed)
Patient calling - is still having trouble getting [facility] [place], [place] Would like to know if we can resend or call and give them a verbal  Please inform patient of status

## 2019-09-17 NOTE — Telephone Encounter (Signed)
Requested Prescriptions   Signed Prescriptions Disp Refills   cloNIDine (CATAPRES) 0.1 MG tablet 180 tablet 0    Sig: Take 1 tablet (0.1 mg total) by mouth 2 (two) times daily.    Authorizing Provider: Kathlyn Sacramento A    Ordering User: Janan Ridge   diltiazem (CARDIZEM CD) 120 MG 24 hr capsule 90 capsule 0    Sig: Take 1 capsule (120 mg total) by mouth daily.    Authorizing Provider: Kathlyn Sacramento A    Ordering User: Janan Ridge

## 2019-12-10 ENCOUNTER — Telehealth: Payer: Self-pay | Admitting: Family

## 2019-12-10 ENCOUNTER — Other Ambulatory Visit: Payer: Self-pay | Admitting: Cardiovascular Disease

## 2019-12-10 NOTE — Telephone Encounter (Signed)
°  Patient Consent for Virtual Visit         Geoffrey [name] has provided verbal consent on 12/10/2019 for a virtual visit (video or telephone).   CONSENT FOR VIRTUAL VISIT FOR:  Geoffrey [name]  By participating in this virtual visit I agree to the following:  I hereby voluntarily request, consent and authorize [facility] and its employed or contracted physicians, physician assistants, nurse practitioners or other licensed health care professionals (the Practitioner), to provide me with telemedicine health care services (the Services") as deemed necessary by the treating Practitioner. I acknowledge and consent to receive the Services by the Practitioner via telemedicine. I understand that the telemedicine visit will involve communicating with the Practitioner through live audiovisual communication technology and the disclosure of certain medical information by electronic transmission. I acknowledge that I have been given the opportunity to request an in-person assessment or other available alternative prior to the telemedicine visit and am voluntarily participating in the telemedicine visit.  I understand that I have the right to withhold or withdraw my consent to the use of telemedicine in the course of my care at any time, without affecting my right to future care or treatment, and that the Practitioner or I may terminate the telemedicine visit at any time. I understand that I have the right to inspect all information obtained and/or recorded in the course of the telemedicine visit and may receive copies of available information for a reasonable fee.  I understand that some of the potential risks of receiving the Services via telemedicine include:   Delay or interruption in medical evaluation due to technological equipment failure or disruption;  Information transmitted may not be sufficient (e.g. poor resolution of images) to allow for appropriate medical decision making by the Practitioner;  and/or   In rare instances, security protocols could fail, causing a breach of personal health information.  Furthermore, I acknowledge that it is my responsibility to provide information about my medical history, conditions and care that is complete and accurate to the best of my ability. I acknowledge that Practitioner's advice, recommendations, and/or decision may be based on factors not within their control, such as incomplete or inaccurate data provided by me or distortions of diagnostic images or specimens that may result from electronic transmissions. I understand that the practice of medicine is not an exact science and that Practitioner makes no warranties or guarantees regarding treatment outcomes. I acknowledge that a copy of this consent can be made available to me via my patient portal ([facility]), or I can request a printed copy by calling the office of [facility].    I understand that my insurance will be billed for this visit.   I have read or had this consent read to me.  I understand the contents of this consent, which adequately explains the benefits and risks of the Services being provided via telemedicine.   I have been provided ample opportunity to ask questions regarding this consent and the Services and have had my questions answered to my satisfaction.  I give my informed consent for the services to be provided through the use of telemedicine in my medical care

## 2019-12-10 NOTE — Telephone Encounter (Signed)
Pt overdue for f/u pt last seen 05/2018. Please contact pt for future appointment.

## 2019-12-11 NOTE — Progress Notes (Signed)
Virtual Visit via Telephone Note   This visit type was conducted due to national recommendations for restrictions regarding the COVID-19 Pandemic (e.g. social distancing) in an effort to limit this patient's exposure and mitigate transmission in our community.  Due to his co-morbid illnesses, this patient is at least at moderate risk for complications without adequate follow up.  This format is felt to be most appropriate for this patient at this time.  The patient did not have access to video technology/had technical difficulties with video requiring transitioning to audio format only (telephone).  All issues noted in this document were discussed and addressed.  No physical exam could be performed with this format.  Please refer to the patient's chart for his  consent to telehealth for [facility].   Date:  12/14/2019   ID:  Geoffrey [name], DOB 23-May-1965, MRN [id number] The patient was identified using 2 identifiers.  Patient Location: Home Provider Location: Office/Clinic  PCP:  Mar Daring, PA-C  Cardiologist:  Kathlyn Sacramento, MD  Electrophysiologist:  None   Evaluation Performed:  Follow-Up Visit  Chief Complaint:  HTN  History of Present Illness:    Geoffrey [name] is a 54 y.o. male with HTN, sleep apnea, palpitations, obesity, left chest schwannoma s/p resection (2015), COPD, angioedema in the setting of Lisinopril, atrial flutter with RVR (04/2018).  He was last seen 06/04/2018 by Ignacia Bayley, NP.    Prior history of intermittent chest pain evaluated by PCP & cardiology in [place] in 2015. Echo at that time with normal LVEF and severe LVH. Renal artery [place] normal, CXR with left lung mass, PET negative. Underwent VATS at [facility] with resection of left schest schwannoma. Postoperatively he developed pleural effusion which required thoracentesis (05/2014). Seen summer 2018 with palpitations, dizziness, bradycardia. Dizziness thought to be due to bradycardia and Verapamil dose  reduced. Echo with normal LVEF, moderate LVH, gr2DD.   February 2020 he had episode of angioedema in setting of lisinopril. Treated, went home AMA, returned next day with neck/jaw pain, fatigue, sensation of "weight on his chest".  Atrial flutter with RVR.  He spontaneously converted on IV diltiazem but felt dizzy and fatigue in the setting of sinus bradycardia.  He was discharged on a trial of nifedipine 30 mg daily and Eliquis.  At follow-up due to recurrent palpitations and hemoptysis, epistaxis, gingival bleeding, 1 black stool who was placed on ZIO monitor.  CBC and CXR were normal.  His BP was elevated and he was transitioned from nifedipine to diltiazem 120 mg daily.  05/29/2018 he called with elevated blood pressures when started on hydralazine 10 mg 3 times daily.  He was trialed on a dose of 20 mg and reported headache.  ZIO monitow 05/2018 with predominantly NSR, intermittent episodes irregular tachycardia with some wide QRS suggestive of atrial rib fib with RVR abberrancy longest lasting 11 beats. Due to low burden and bleeding on Eliquis, It was discontinued.   Clinic visit 06/04/18 noted elevated BP. His Hydralazine was stopped and he was transitioned to Clonidine.   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  Tells me overall he continues to feel well.  Has not checked his blood pressure and heart rate recently.  Notes intermittent lower extremity edema predominantly in his feet.  We reviewed the likely etiology of venous insufficiency and  preventative measures.  Tells me palpitations are "great "and not bothersome.  Of note, he has been living in [place] since January 2021 and has not established with cardiology or primary care in the area.  The patient does not have symptoms concerning for COVID-19 infection (fever,  chills, cough, or new shortness of breath).    Past Medical History:  Diagnosis Date  . Allergic rhinitis   . Angio-edema (lisinopril)    a. 04/2018 angioedema in setting of lisinopril rx (had been on for ~ 10 yrs prior but recently refilled before event).  . Asthma   . Basal cell carcinoma of skin   . COPD (chronic obstructive pulmonary disease) ([place])   . Diastolic dysfunction    a. 10/2016 Echo: EF 55-65%. Gr2 DD. Mod LVH. Mildly dil LA/RA. Nl RV fxn; b. 04/2018 Echo: EF 60-65%, mod asymm LVH.  Marland Kitchen Hay fever   . Hypertension   . Morbid obesity ([place])   . PAF (paroxysmal atrial fibrillation) ([place])    a. 04/2018 Admitted w/ Afib and fatigue. Converted during admission; b. CHA2DS2VASc = 1-->eliquis.  . Schwannoma (Left chest wall)    a. 2015 s/p VATS/resection ([facility]).  . Sleep apnea    Past Surgical History:  Procedure Laterality Date  . APPENDECTOMY    . LUNG SURGERY      Current Meds  Medication Sig  . cloNIDine (CATAPRES) 0.1 MG tablet Take 1 tablet (0.1 mg total) by mouth 2 (two) times daily.  Marland Kitchen diltiazem (CARDIZEM CD) 120 MG 24 hr capsule Take 1 capsule (120 mg total) by mouth daily.  . [DISCONTINUED] cloNIDine (CATAPRES) 0.1 MG tablet TAKE 1 TABLET BY MOUTH TWICE A DAY  . [DISCONTINUED] diltiazem (CARDIZEM CD) 120 MG 24 hr capsule TAKE 1 CAPSULE BY MOUTH EVERY DAY    Allergies:   Ibuprofen, Lisinopril, Amlodipine, and Aspirin   Social History   Tobacco Use  . Smoking status: Former Smoker    Types: Pipe, Cigars, Cigarettes    Quit date: 09/25/2010    Years since quitting: 9.2  . Smokeless tobacco: Former Systems developer    Types: Secondary school teacher  . Vaping Use: Never used  Substance Use Topics  . Alcohol use: Yes    Comment: 3-4 Bi-weekly; have been a heavy drinker in the past.  . Drug use: No    Family Hx: The patient's family history includes Cerebral aneurysm in his father and mother; Hypertension in his father.  ROS:    Review of Systems  Constitutional: Negative for  chills, fever and malaise/fatigue.  Cardiovascular: Negative for chest pain, dyspnea on exertion, leg swelling, near-syncope, orthopnea, palpitations and syncope.  Respiratory: Negative for cough, shortness of breath and wheezing.   Gastrointestinal: Negative for nausea and vomiting.  Neurological: Negative for dizziness, light-headedness and weakness.   All other systems reviewed and are negative.  Prior CV studies:   The following studies were reviewed today:  Echo 04/2018  1. The left ventricle has normal systolic function with an ejection  fraction of 60-65%. The cavity size was normal. There is moderate  asymmetric left ventricular hypertrophy. Left ventricular diastology could  not be evaluated due to nondiagnostic  images.   2. Left atrial size was not well visualized.   3. The mitral valve was not well visualized. Mild thickening of the  mitral valve leaflet.   4. The tricuspid valve is not well visualized. Tricuspid valve  regurgitation was not assessed by color flow Doppler.   5. The aortic valve  was not well visualized Aortic valve regurgitation  was not assessed by color flow Doppler.   6. The aortic root is normal in size and structure.   7. The interatrial septum was not well visualized.   Monitor 04/2018 14 days outpatient monitor: Normal sinus rhythm with an average heart rate of 69 bpm. Intermittent episodes of irregular tachycardia some with wide QRS suggestive of A. fib with RVR with aberrancy.  The longest tachycardia lasted 11 beats. Rare PVCs.  Labs/Other Tests and Data Reviewed:    EKG:  An ECG dated 06/04/18 was personally reviewed today and demonstrated:  NSR 93 bpm with LVH  Recent Labs: No results found for requested labs within last 8760 hours.   Recent Lipid Panel Lab Results  Component Value Date/Time   CHOL 200 (H) 10/02/2016 12:35 PM   TRIG 57 10/02/2016 12:35 PM   HDL 56 10/02/2016 12:35 PM   CHOLHDL 3.6 10/02/2016 12:35 PM   LDLCALC 133  (H) 10/02/2016 12:35 PM    Wt Readings from Last 3 Encounters:  12/14/19 (!) 337 lb (152.9 kg)  06/04/18 (!) 337 lb 8 oz (153.1 kg)  05/22/18 (!) 336 lb 8 oz (152.6 kg)    Objective:    Vital Signs:  Ht 6\' 2"  (1.88 m)   Wt (!) 337 lb (152.9 kg)   BMI 43.27 kg/m    VITAL SIGNS:  reviewed  ASSESSMENT & PLAN:    1. HTN-difficult to ascertain BP control as he has not been checking at home and this is a telephone visit.  Encouraged to check BP at home.  Continue present antihypertensive regimen.  Of note he has [place] out of state and will need to establish with cardiology or primary care in that area.  Continue diltiazem 120 mg daily and clonidine 0.1 mg twice daily.  Denies lightheadedness, dizziness, near syncope.  2. Palpitations/Atrial flutter- Reports no palpitations nor recurrent atrial flutter.  Continue diltiazem 120 mg daily.  Eliquis previously discontinued due to low burden on ZIO and side effects on Eliquis.  3. Obesity -weight loss via diet and exercise encouraged.  4. OSA- CPAP compliance encouraged  COVID-19 Education: The signs and symptoms of COVID-19 were discussed with the patient and how to seek care for testing (follow up with PCP or arrange E-visit).  The importance of social distancing was discussed today.  Time:   Today, I have spent 15 minutes with the patient with telehealth technology discussing the above problems.    Medication Adjustments/Labs and Tests Ordered: Current medicines are reviewed at length with the patient today.  Concerns regarding medicines are outlined above.   Tests Ordered: No orders of the defined types were placed in this encounter.  Medication Changes: Meds ordered this encounter  Medications  . cloNIDine (CATAPRES) 0.1 MG tablet    Sig: Take 1 tablet (0.1 mg total) by mouth 2 (two) times daily.    Dispense:  180 tablet    Refill:  0    Order Specific Question:   Supervising Provider    Answer:   Richardo Priest [id number]  .  diltiazem (CARDIZEM CD) 120 MG 24 hr capsule    Sig: Take 1 capsule (120 mg total) by mouth daily.    Dispense:  90 capsule    Refill:  0    Please establish with primary care and/or cardiology in your area for future refills.    Order Specific Question:   Supervising Provider    Answer:   Shirlee More  J [[id number]]   Follow Up: To establish care in [place] where he is residing.  Signed, Loel Dubonnet, NP  12/14/2019 9:03 AM    [place]

## 2019-12-14 ENCOUNTER — Encounter: Payer: Self-pay | Admitting: Family

## 2019-12-14 ENCOUNTER — Telehealth (INDEPENDENT_AMBULATORY_CARE_PROVIDER_SITE_OTHER): Payer: Self-pay | Admitting: Family

## 2019-12-14 VITALS — Ht 74.0 in | Wt 337.0 lb

## 2019-12-14 DIAGNOSIS — I10 Essential (primary) hypertension: Secondary | ICD-10-CM

## 2019-12-14 DIAGNOSIS — G4733 Obstructive sleep apnea (adult) (pediatric): Secondary | ICD-10-CM

## 2019-12-14 MED ORDER — CLONIDINE HCL 0.1 MG PO TABS: 0.1000 mg | ORAL_TABLET | Freq: Two times a day (BID) | ORAL | 0 refills | Status: AC

## 2019-12-14 MED ORDER — DILTIAZEM HCL ER COATED BEADS 120 MG PO CP24: 120.0000 mg | ORAL_CAPSULE | Freq: Every day | ORAL | 0 refills | Status: AC

## 2019-12-14 NOTE — Patient Instructions (Addendum)
Medication Instructions:  No medication changes today.   90 day supply sent to your pharmacy in [place].  For further refills, please establish with cardiology or primary care in your area. It is considered best practice to be monitored at least annually with EKG which we are unable to do via telemedicine.   From Dr. Fletcher Anon:  "I did temporary cardiology work before in that area. There are multiple cardiology options through [facility] or [facility]. If he prefers a smaller group, there is [facility] cardiology.  I don't know them personally so I can not make a specific recommendation. "  *If you need a refill on your cardiac medications before your next appointment, please call your pharmacy*   Lab Work: None ordered today.   Testing/Procedures: None ordered today.   Follow-Up: At [facility], you and your health needs are our priority.  As part of our continuing mission to provide you with exceptional heart care, we have created designated Provider Care Teams.  These Care Teams include your primary Cardiologist (physician) and Advanced Practice Providers (APPs -  Physician Assistants and Nurse Practitioners) who all work together to provide you with the care you need, when you need it.  We recommend signing up for the patient portal called "MyChart".  Sign up information is provided on this After Visit Summary.  MyChart is used to connect with patients for Virtual Visits (Telemedicine).  Patients are able to view lab/test results, encounter notes, upcoming appointments, etc.  Non-urgent messages can be sent to your provider as well.   To learn more about what you can do with MyChart, go to [email or web address].    Your next appointment:   Please establish with primary care and/or cardiology in [place].   Other Instructions  For lower extremity swelling,   Recommend low-sodium, heart healthy diet  Recommend elevating lower extremities when sitting  Recommend wearing  compression stockings or 'diabetic socks'.  Please check your blood pressure 1-2 times per week and keep a log.    DASH Eating Plan DASH stands for "Dietary Approaches to Stop Hypertension." The DASH eating plan is a healthy eating plan that has been shown to reduce high blood pressure (hypertension). It may also reduce your risk for type 2 diabetes, heart disease, and stroke. The DASH eating plan may also help with weight loss. What are tips for following this plan?  General guidelines  Avoid eating more than 2,300 mg (milligrams) of salt (sodium) a day. If you have hypertension, you may need to reduce your sodium intake to 1,500 mg a day.  Limit alcohol intake to no more than 1 drink a day for nonpregnant women and 2 drinks a day for men. One drink equals 12 oz of beer, 5 oz of wine, or 1 oz of hard liquor.  Work with your health care provider to maintain a healthy body weight or to lose weight. Ask what an ideal weight is for you.  Get at least 30 minutes of exercise that causes your heart to beat faster (aerobic exercise) most days of the week. Activities may include walking, swimming, or biking.  Work with your health care provider or diet and nutrition specialist (dietitian) to adjust your eating plan to your individual calorie needs. Reading food labels   Check food labels for the amount of sodium per serving. Choose foods with less than 5 percent of the Daily Value of sodium. Generally, foods with less than 300 mg of sodium per serving fit into this eating plan.  To find whole grains, look for the word "whole" as the first word in the ingredient list. Shopping  Buy products labeled as "low-sodium" or "no salt added."  Buy fresh foods. Avoid canned foods and premade or frozen meals. Cooking  Avoid adding salt when cooking. Use salt-free seasonings or herbs instead of table salt or sea salt. Check with your health care provider or pharmacist before using salt  substitutes.  Do not fry foods. Cook foods using healthy methods such as baking, boiling, grilling, and broiling instead.  Cook with heart-healthy oils, such as olive, canola, soybean, or sunflower oil. Meal planning  Eat a balanced diet that includes: ? 5 or more servings of fruits and vegetables each day. At each meal, try to fill half of your plate with fruits and vegetables. ? Up to 6-8 servings of whole grains each day. ? Less than 6 oz of lean meat, poultry, or fish each day. A 3-oz serving of meat is about the same size as a deck of cards. One egg equals 1 oz. ? 2 servings of low-fat dairy each day. ? A serving of nuts, seeds, or beans 5 times each week. ? Heart-healthy fats. Healthy fats called Omega-3 fatty acids are found in foods such as flaxseeds and coldwater fish, like sardines, salmon, and mackerel.  Limit how much you eat of the following: ? Canned or prepackaged foods. ? Food that is high in trans fat, such as fried foods. ? Food that is high in saturated fat, such as fatty meat. ? Sweets, desserts, sugary drinks, and other foods with added sugar. ? Full-fat dairy products.  Do not salt foods before eating.  Try to eat at least 2 vegetarian meals each week.  Eat more home-cooked food and less restaurant, buffet, and fast food.  When eating at a restaurant, ask that your food be prepared with less salt or no salt, if possible. What foods are recommended? The items listed may not be a complete list. Talk with your dietitian about what dietary choices are best for you. Grains Whole-grain or whole-wheat bread. Whole-grain or whole-wheat pasta. Brown rice. [place]row. Bulgur. Whole-grain and low-sodium cereals. Pita bread. Low-fat, low-sodium crackers. Whole-wheat flour tortillas. Vegetables Fresh or frozen vegetables (raw, steamed, roasted, or grilled). Low-sodium or reduced-sodium tomato and vegetable juice. Low-sodium or reduced-sodium tomato sauce and tomato  paste. Low-sodium or reduced-sodium canned vegetables. Fruits All fresh, dried, or frozen fruit. Canned fruit in natural juice (without added sugar). Meat and other protein foods Skinless chicken or [place]. Ground chicken or [place]. Pork with fat trimmed off. Fish and seafood. Egg whites. Dried beans, peas, or lentils. Unsalted nuts, nut butters, and seeds. Unsalted canned beans. Lean cuts of beef with fat trimmed off. Low-sodium, lean deli meat. Dairy Low-fat (1%) or fat-free (skim) milk. Fat-free, low-fat, or reduced-fat cheeses. Nonfat, low-sodium ricotta or cottage cheese. Low-fat or nonfat yogurt. Low-fat, low-sodium cheese. Fats and oils Soft margarine without trans fats. Vegetable oil. Low-fat, reduced-fat, or light mayonnaise and salad dressings (reduced-sodium). Canola, safflower, olive, soybean, and sunflower oils. Avocado. Seasoning and other foods Herbs. Spices. Seasoning mixes without salt. Unsalted popcorn and pretzels. Fat-free sweets. What foods are not recommended? The items listed may not be a complete list. Talk with your dietitian about what dietary choices are best for you. Grains Baked goods made with fat, such as croissants, muffins, or some breads. Dry pasta or rice meal packs. Vegetables Creamed or fried vegetables. Vegetables in a cheese sauce. Regular canned vegetables (not  low-sodium or reduced-sodium). Regular canned tomato sauce and paste (not low-sodium or reduced-sodium). Regular tomato and vegetable juice (not low-sodium or reduced-sodium). Angie Fava. Olives. Fruits Canned fruit in a light or heavy syrup. Fried fruit. Fruit in cream or butter sauce. Meat and other protein foods Fatty cuts of meat. Ribs. Fried meat. Berniece Salines. Sausage. Bologna and other processed lunch meats. Salami. Fatback. Hotdogs. Bratwurst. Salted nuts and seeds. Canned beans with added salt. Canned or smoked fish. Whole eggs or egg yolks. Chicken or [place] with skin. Dairy Whole or 2% milk,  cream, and half-and-half. Whole or full-fat cream cheese. Whole-fat or sweetened yogurt. Full-fat cheese. Nondairy creamers. Whipped toppings. Processed cheese and cheese spreads. Fats and oils Butter. Stick margarine. Lard. Shortening. Ghee. Bacon fat. Tropical oils, such as coconut, palm kernel, or palm oil. Seasoning and other foods Salted popcorn and pretzels. Onion salt, garlic salt, seasoned salt, table salt, and sea salt. Worcestershire sauce. Tartar sauce. Barbecue sauce. Teriyaki sauce. Soy sauce, including reduced-sodium. Steak sauce. Canned and packaged gravies. Fish sauce. Oyster sauce. Cocktail sauce. Horseradish that you find on the shelf. Ketchup. Mustard. Meat flavorings and tenderizers. Bouillon cubes. Hot sauce and Tabasco sauce. Premade or packaged marinades. Premade or packaged taco seasonings. Relishes. Regular salad dressings. Where to find more information:  National Heart, Lung, and Felt: https://wilson-eaton.com/  American Heart Association: www.heart.org Summary  The DASH eating plan is a healthy eating plan that has been shown to reduce high blood pressure (hypertension). It may also reduce your risk for type 2 diabetes, heart disease, and stroke.  With the DASH eating plan, you should limit salt (sodium) intake to 2,300 mg a day. If you have hypertension, you may need to reduce your sodium intake to 1,500 mg a day.  When on the DASH eating plan, aim to eat more fresh fruits and vegetables, whole grains, lean proteins, low-fat dairy, and heart-healthy fats.  Work with your health care provider or diet and nutrition specialist (dietitian) to adjust your eating plan to your individual calorie needs. This information is not intended to replace advice given to you by your health care provider. Make sure you discuss any questions you have with your health care provider. Document Revised: 02/22/2017 Document Reviewed: 03/05/2016 Elsevier Patient Education  2020 [employer].

## 2020-01-11 ENCOUNTER — Telehealth: Payer: Self-pay | Admitting: Student in an Organized Health Care Education/Training Program

## 2020-01-13 NOTE — Telephone Encounter (Signed)
ERROR - please disregard.

## 2020-04-10 ENCOUNTER — Other Ambulatory Visit: Payer: Self-pay | Admitting: Family

## 2020-04-11 ENCOUNTER — Other Ambulatory Visit: Payer: Self-pay | Admitting: Family
